# Patient Record
Sex: Female | Born: 1940 | Race: White | Hispanic: No | Smoking: Never smoker
Health system: Southern US, Community
[De-identification: ages and names within clinical notes are randomized; demographics above are authoritative.]

## PROBLEM LIST (undated history)

## (undated) DIAGNOSIS — E785 Hyperlipidemia, unspecified: Secondary | ICD-10-CM

## (undated) DIAGNOSIS — I071 Rheumatic tricuspid insufficiency: Secondary | ICD-10-CM

## (undated) DIAGNOSIS — E039 Hypothyroidism, unspecified: Secondary | ICD-10-CM

## (undated) DIAGNOSIS — Z860100 Personal history of colon polyps, unspecified: Secondary | ICD-10-CM

## (undated) DIAGNOSIS — E079 Disorder of thyroid, unspecified: Secondary | ICD-10-CM

## (undated) DIAGNOSIS — S82843A Displaced bimalleolar fracture of unspecified lower leg, initial encounter for closed fracture: Secondary | ICD-10-CM

## (undated) DIAGNOSIS — Z1239 Encounter for other screening for malignant neoplasm of breast: Secondary | ICD-10-CM

## (undated) DIAGNOSIS — Z124 Encounter for screening for malignant neoplasm of cervix: Secondary | ICD-10-CM

## (undated) HISTORY — PX: ESOPHAGOGASTRODUODENOSCOPY: SHX1529

## (undated) HISTORY — PX: BUNIONECTOMY: SHX129

## (undated) HISTORY — DX: Personal history of colon polyps, unspecified: Z86.0100

## (undated) HISTORY — DX: Encounter for screening for malignant neoplasm of cervix: Z12.4

## (undated) HISTORY — DX: Disorder of thyroid, unspecified: E07.9

## (undated) HISTORY — DX: Encounter for other screening for malignant neoplasm of breast: Z12.39

## (undated) HISTORY — PX: COLONOSCOPY: SHX174

## (undated) HISTORY — DX: Displaced bimalleolar fracture of unspecified lower leg, initial encounter for closed fracture: S82.843A

## (undated) HISTORY — DX: Hyperlipidemia, unspecified: E78.5

## (undated) HISTORY — DX: Rheumatic tricuspid insufficiency: I07.1

## (undated) HISTORY — PX: HERNIA REPAIR: SHX51

## (undated) HISTORY — PX: FRACTURE SURGERY: SHX138

## (undated) HISTORY — PX: OTHER SURGICAL HISTORY: SHX169

---

## 1968-10-15 HISTORY — PX: BREAST CYST EXCISION: SHX579

## 1974-10-15 HISTORY — PX: VAGINAL HYSTERECTOMY: SUR661

## 1982-10-15 HISTORY — PX: TRIGGER FINGER RELEASE: SHX641

## 1992-10-15 HISTORY — PX: SPINE SURGERY: SHX786

## 2008-10-15 DIAGNOSIS — Z124 Encounter for screening for malignant neoplasm of cervix: Secondary | ICD-10-CM

## 2008-10-15 HISTORY — DX: Encounter for screening for malignant neoplasm of cervix: Z12.4

## 2009-01-06 LAB — HM COLONOSCOPY: HM Colonoscopy: NORMAL

## 2009-10-15 DIAGNOSIS — Z1239 Encounter for other screening for malignant neoplasm of breast: Secondary | ICD-10-CM

## 2009-10-15 HISTORY — DX: Encounter for other screening for malignant neoplasm of breast: Z12.39

## 2010-10-08 LAB — HM MAMMOGRAPHY: HM Mammogram: NORMAL

## 2011-02-17 DIAGNOSIS — S82843A Displaced bimalleolar fracture of unspecified lower leg, initial encounter for closed fracture: Secondary | ICD-10-CM

## 2011-02-17 HISTORY — DX: Displaced bimalleolar fracture of unspecified lower leg, initial encounter for closed fracture: S82.843A

## 2011-08-15 ENCOUNTER — Ambulatory Visit (INDEPENDENT_AMBULATORY_CARE_PROVIDER_SITE_OTHER): Payer: Medicare Other | Admitting: Internal Medicine

## 2011-08-15 ENCOUNTER — Encounter: Payer: Self-pay | Admitting: Internal Medicine

## 2011-08-15 DIAGNOSIS — E559 Vitamin D deficiency, unspecified: Secondary | ICD-10-CM

## 2011-08-15 DIAGNOSIS — E039 Hypothyroidism, unspecified: Secondary | ICD-10-CM

## 2011-08-15 DIAGNOSIS — E785 Hyperlipidemia, unspecified: Secondary | ICD-10-CM

## 2011-08-15 DIAGNOSIS — Z800 Family history of malignant neoplasm of digestive organs: Secondary | ICD-10-CM

## 2011-08-15 DIAGNOSIS — Z13220 Encounter for screening for lipoid disorders: Secondary | ICD-10-CM

## 2011-08-15 DIAGNOSIS — S82843A Displaced bimalleolar fracture of unspecified lower leg, initial encounter for closed fracture: Secondary | ICD-10-CM | POA: Insufficient documentation

## 2011-08-15 DIAGNOSIS — Z1239 Encounter for other screening for malignant neoplasm of breast: Secondary | ICD-10-CM | POA: Insufficient documentation

## 2011-08-15 DIAGNOSIS — R5383 Other fatigue: Secondary | ICD-10-CM

## 2011-08-15 DIAGNOSIS — Z13820 Encounter for screening for osteoporosis: Secondary | ICD-10-CM | POA: Insufficient documentation

## 2011-08-15 DIAGNOSIS — E538 Deficiency of other specified B group vitamins: Secondary | ICD-10-CM

## 2011-08-15 LAB — COMPREHENSIVE METABOLIC PANEL
ALT: 26 U/L (ref 0–35)
Albumin: 4.4 g/dL (ref 3.5–5.2)
CO2: 27 mEq/L (ref 19–32)
Calcium: 9.5 mg/dL (ref 8.4–10.5)
Chloride: 106 mEq/L (ref 96–112)
Creatinine, Ser: 1.1 mg/dL (ref 0.4–1.2)
GFR: 55.23 mL/min — ABNORMAL LOW (ref >60.00)
Potassium: 4.2 mEq/L (ref 3.5–5.1)
Sodium: 141 mEq/L (ref 135–145)
Total Protein: 7.5 g/dL (ref 6.0–8.3)

## 2011-08-15 LAB — LIPID PANEL
Total CHOL/HDL Ratio: 3
Triglycerides: 107 mg/dL (ref 0.0–149.0)

## 2011-08-15 LAB — CBC WITH DIFFERENTIAL/PLATELET
Basophils Absolute: 0 10*3/uL (ref 0.0–0.1)
Lymphocytes Relative: 29 % (ref 12.0–46.0)
Lymphs Abs: 1.8 10*3/uL (ref 0.7–4.0)
Monocytes Relative: 7 % (ref 3.0–12.0)
Neutrophils Relative %: 61.6 % (ref 43.0–77.0)
Platelets: 245 10*3/uL (ref 150.0–400.0)
RDW: 14.1 % (ref 11.5–14.6)
WBC: 6.1 10*3/uL (ref 4.5–10.5)

## 2011-08-15 LAB — TSH: TSH: 3 u[IU]/mL (ref 0.35–5.50)

## 2011-08-15 LAB — MAGNESIUM: Magnesium: 2.2 mg/dL (ref 1.5–2.5)

## 2011-08-15 NOTE — Assessment & Plan Note (Addendum)
Prior mammogram done by PCP who moved and office went out of business, last Fall.  Dr Seleta Rhymes [phone].  Jeanice Lim Kateri Mc

## 2011-08-16 NOTE — Progress Notes (Addendum)
  Subjective:    Patient ID: Catherine Smith, female    DOB: Jun 19, 1941, 70 y.o.   MRN: [id number]  HPI  70 yo white female with a history of hyothyroidism and hypertension presents for establishment of care .  She has no acute issues today.  She suffered a fall at home In May 2012 and  had a bimalleolar fracture as a result.  Seh continues to work with PT and is wearing Orthotics brace.  Some pain and edema noted by the end of the day. Ambulating with use of a cane.   Past Medical History  Diagnosis Date  . Bimalleolar ankle fracture Feb 17 2011    surgery Dr. Olga Millers and Jarold Motto, [facility]  . Hyperlipidemia   . Thyroid disease   . Screening for breast cancer 2011    normal  . Pap smear for cervical cancer screening 2010    normal   No current outpatient prescriptions on file prior to visit.     Review of Systems  Constitutional: Negative for fever, chills and unexpected weight change.  HENT: Negative for hearing loss, ear pain, nosebleeds, congestion, sore throat, facial swelling, rhinorrhea, sneezing, mouth sores, trouble swallowing, neck pain, neck stiffness, voice change, postnasal drip, sinus pressure, tinnitus and ear discharge.   Eyes: Negative for pain, discharge, redness and visual disturbance.  Respiratory: Negative for cough, chest tightness, shortness of breath, wheezing and stridor.   Cardiovascular: Negative for chest pain, palpitations and leg swelling.  Musculoskeletal: Positive for joint swelling and arthralgias. Negative for myalgias.  Skin: Negative for color change and rash.  Neurological: Negative for dizziness, weakness, light-headedness and headaches.  Hematological: Negative for adenopathy.      BP 130/73  Pulse 95  Temp(Src) 97.8 F (36.6 C) (Oral)  Resp 16  Ht 5\' 4"  (1.626 m)  Wt 152 lb 8 oz (69.174 kg)  BMI 26.18 kg/m2  SpO2 98%  Objective:   Physical Exam  Constitutional: She is oriented to person, place, and time. She appears well-developed and  well-nourished.  HENT:  Mouth/Throat: Oropharynx is clear and moist.  Eyes: EOM are normal. Pupils are equal, round, and reactive to light. No scleral icterus.  Neck: Normal range of motion. Neck supple. No JVD present. No thyromegaly present.  Cardiovascular: Normal rate, regular rhythm, normal heart sounds and intact distal pulses.   Pulmonary/Chest: Effort normal and breath sounds normal.  Abdominal: Soft. Bowel sounds are normal. She exhibits no mass. There is no tenderness.  Musculoskeletal: She exhibits edema and tenderness.       Left ankle: She exhibits swelling. tenderness. Lateral malleolus and medial malleolus tenderness found.  Lymphadenopathy:    She has no cervical adenopathy.  Neurological: She is alert and oriented to person, place, and time.  Skin: Skin is warm and dry.  Psychiatric: She has a normal mood and affect.          Assessment & Plan:  History of bimalleolar fracture secondary to fall.  She has undergone surgery and PT but still wears the brace per orthoepdics.    Edema:  Secondary to trauma, with dependent edema,,  Elevate leg .  Hypothyroidism:  Is in need of TSH recehck as it has been 6 months.  Hypertension: well controlled currently.  Preventive screenings:  Breast, colon and Cervical cancers reviewed.  She is s/p hysterectomy, is due for mmamogram this fall, and receives q 3 yr colonoscopies at [facility] due to her family history .

## 2011-08-19 ENCOUNTER — Encounter: Payer: Self-pay | Admitting: Internal Medicine

## 2011-08-19 DIAGNOSIS — Z124 Encounter for screening for malignant neoplasm of cervix: Secondary | ICD-10-CM | POA: Insufficient documentation

## 2011-08-20 ENCOUNTER — Ambulatory Visit: Payer: Medicare Other | Admitting: *Deleted

## 2011-08-20 DIAGNOSIS — E538 Deficiency of other specified B group vitamins: Secondary | ICD-10-CM

## 2011-08-20 MED ORDER — CYANOCOBALAMIN 1000 MCG/ML IJ SOLN
1000.0000 ug | Freq: Once | INTRAMUSCULAR | Status: AC
  Administered 2011-08-20: 1000 ug via INTRAMUSCULAR

## 2011-08-24 ENCOUNTER — Encounter: Payer: Self-pay | Admitting: Internal Medicine

## 2011-08-24 NOTE — Progress Notes (Deleted)
  Subjective:    Patient ID: Catherine Smith, female    DOB: Mar 30, 1941, 70 y.o.   MRN: [id number]  HPI 70 yo white female with a history of hypertension and previous ankle fractures (traumatic)who is  here to establish primary care.She has no acute issues today.  She is recovering physically from her ankle fractures and contineus to wear a brace for support and attend PT.  Some pain and LE edema with prolonged standing .  Past Medical History  Diagnosis Date  . Bimalleolar ankle fracture Feb 17 2011    surgery Dr. Olga Millers and Jarold Motto, [facility]  . Hyperlipidemia   . Thyroid disease   . Screening for breast cancer 2011    normal  . Pap smear for cervical cancer screening 2010    normal   No current outpatient prescriptions on file prior to visit.       Review of Systems  Constitutional: Positive for activity change. Negative for fever, chills and unexpected weight change.  HENT: Negative for hearing loss, ear pain, nosebleeds, congestion, sore throat, facial swelling, rhinorrhea, sneezing, mouth sores, trouble swallowing, neck pain, neck stiffness, voice change, postnasal drip, sinus pressure, tinnitus and ear discharge.   Eyes: Negative for pain, discharge, redness and visual disturbance.  Respiratory: Negative for cough, chest tightness, shortness of breath, wheezing and stridor.   Cardiovascular: Positive for leg swelling. Negative for chest pain and palpitations.  Musculoskeletal: Positive for arthralgias. Negative for myalgias.  Skin: Negative for color change and rash.  Neurological: Negative for dizziness, weakness, light-headedness and headaches.  Hematological: Negative for adenopathy.       Objective:   Physical Exam  Constitutional: She is oriented to person, place, and time. She appears well-developed and well-nourished.  HENT:  Mouth/Throat: Oropharynx is clear and moist.  Eyes: EOM are normal. Pupils are equal, round, and reactive to light. No scleral icterus.    Neck: Normal range of motion. Neck supple. No JVD present. No thyromegaly present.  Cardiovascular: Normal rate, regular rhythm, normal heart sounds and intact distal pulses.   Pulmonary/Chest: Effort normal and breath sounds normal.  Abdominal: Soft. Bowel sounds are normal. She exhibits no mass. There is no tenderness.  Musculoskeletal: Normal range of motion. She exhibits edema and tenderness.       Left ankle: She exhibits swelling. tenderness.  Lymphadenopathy:    She has no cervical adenopathy.  Neurological: She is alert and oriented to person, place, and time.  Skin: Skin is warm and dry.  Psychiatric: She has a normal mood and affect.          Assessment & Plan:

## 2011-08-24 NOTE — Progress Notes (Deleted)
  Subjective:    Patient ID: Catherine Smith, female    DOB: 05-17-1941, 70 y.o.   MRN: [id number]  HPI    Review of Systems     Objective:   Physical Exam        Assessment & Plan:

## 2011-08-24 NOTE — Progress Notes (Deleted)
  Subjective:    Patient ID: Catherine Smith, female    DOB: Mar 17, 1941, 69 y.o.   MRN: [id number]  HPI    Review of Systems     Objective:   Physical Exam  Constitutional: She is oriented to person, place, and time. She appears well-developed and well-nourished.  HENT:  Mouth/Throat: Oropharynx is clear and moist.  Eyes: EOM are normal. Pupils are equal, round, and reactive to light. No scleral icterus.  Neck: Normal range of motion. Neck supple. No JVD present. No thyromegaly present.  Cardiovascular: Normal rate, regular rhythm, normal heart sounds and intact distal pulses.   Pulmonary/Chest: Effort normal and breath sounds normal.  Abdominal: Soft. Bowel sounds are normal. She exhibits no mass. There is no tenderness.  Musculoskeletal: Normal range of motion. She exhibits no edema.  Lymphadenopathy:    She has no cervical adenopathy.  Neurological: She is alert and oriented to person, place, and time.  Skin: Skin is warm and dry.  Psychiatric: She has a normal mood and affect.          Assessment & Plan:

## 2011-08-27 ENCOUNTER — Ambulatory Visit (INDEPENDENT_AMBULATORY_CARE_PROVIDER_SITE_OTHER): Payer: Medicare Other | Admitting: Internal Medicine

## 2011-08-27 DIAGNOSIS — E538 Deficiency of other specified B group vitamins: Secondary | ICD-10-CM

## 2011-08-27 MED ORDER — CYANOCOBALAMIN 1000 MCG/ML IJ SOLN
1000.0000 ug | Freq: Once | INTRAMUSCULAR | Status: AC
  Administered 2011-08-27: 1000 ug via INTRAMUSCULAR

## 2011-09-11 ENCOUNTER — Ambulatory Visit (INDEPENDENT_AMBULATORY_CARE_PROVIDER_SITE_OTHER): Payer: Medicare Other | Admitting: *Deleted

## 2011-09-11 DIAGNOSIS — E538 Deficiency of other specified B group vitamins: Secondary | ICD-10-CM

## 2011-09-11 MED ORDER — CYANOCOBALAMIN 1000 MCG/ML IJ SOLN
1000.0000 ug | Freq: Once | INTRAMUSCULAR | Status: AC
  Administered 2011-09-11: 1000 ug via INTRAMUSCULAR

## 2011-10-01 ENCOUNTER — Ambulatory Visit (INDEPENDENT_AMBULATORY_CARE_PROVIDER_SITE_OTHER): Payer: Medicare Other | Admitting: *Deleted

## 2011-10-01 DIAGNOSIS — E538 Deficiency of other specified B group vitamins: Secondary | ICD-10-CM

## 2011-10-01 MED ORDER — CYANOCOBALAMIN 1000 MCG/ML IJ SOLN
1000.0000 ug | Freq: Once | INTRAMUSCULAR | Status: AC
  Administered 2011-10-01: 1000 ug via INTRAMUSCULAR

## 2011-10-24 ENCOUNTER — Telehealth: Payer: Self-pay | Admitting: Internal Medicine

## 2011-10-25 ENCOUNTER — Ambulatory Visit: Payer: Medicare Other

## 2011-10-25 MED ORDER — LEVOTHYROXINE SODIUM 50 MCG PO TABS: ORAL_TABLET | ORAL | Status: DC

## 2011-10-25 MED ORDER — ROSUVASTATIN CALCIUM 5 MG PO TABS: 5.0000 mg | ORAL_TABLET | Freq: Every day | ORAL | Status: DC

## 2011-10-25 NOTE — Telephone Encounter (Signed)
Patient is coming in on the 24th for her b12. Rx sent for crestor and synthroid.

## 2011-10-29 ENCOUNTER — Telehealth: Payer: Self-pay | Admitting: *Deleted

## 2011-10-29 MED ORDER — LEVOTHYROXINE SODIUM 50 MCG PO TABS: ORAL_TABLET | ORAL | Status: DC

## 2011-10-29 NOTE — Telephone Encounter (Signed)
One tablet daily,  Chart updated

## 2011-10-29 NOTE — Telephone Encounter (Signed)
[employer] has sent a form asking for clarification for synthroid, form is in your red folder.

## 2011-10-30 NOTE — Telephone Encounter (Signed)
Form faxed back to [employer]. 

## 2011-11-08 ENCOUNTER — Ambulatory Visit (INDEPENDENT_AMBULATORY_CARE_PROVIDER_SITE_OTHER): Payer: Medicare Other | Admitting: *Deleted

## 2011-11-08 DIAGNOSIS — E538 Deficiency of other specified B group vitamins: Secondary | ICD-10-CM | POA: Diagnosis not present

## 2011-11-08 MED ORDER — CYANOCOBALAMIN 1000 MCG/ML IJ SOLN
1000.0000 ug | Freq: Once | INTRAMUSCULAR | Status: AC
  Administered 2011-11-08: 1000 ug via INTRAMUSCULAR

## 2011-12-10 ENCOUNTER — Telehealth: Payer: Self-pay | Admitting: Internal Medicine

## 2011-12-10 NOTE — Telephone Encounter (Signed)
[phone] Pt called to see if her vit b 12 rx was called in  [facility] pharmacy [phone]  Please advise pt when this has been called in.   Pt stated she talked to dr [employer] nurse about this last week

## 2011-12-11 ENCOUNTER — Telehealth: Payer: Self-pay | Admitting: Internal Medicine

## 2011-12-11 MED ORDER — CYANOCOBALAMIN 1000 MCG/ML IJ SOLN: 1000.0000 ug | INTRAMUSCULAR | Status: DC

## 2011-12-11 NOTE — Telephone Encounter (Signed)
Rx has been called in and patient notified 

## 2011-12-11 NOTE — Telephone Encounter (Signed)
[phone] Pt called checking on her rx for b12 at [facility].   The pharmacy still doesn't have this rx its been since 11/08/11 Please advise when called in Pt is going out of town Thursday would like to get rx asap

## 2012-01-08 ENCOUNTER — Telehealth: Payer: Self-pay | Admitting: Internal Medicine

## 2012-01-08 DIAGNOSIS — Z79899 Other long term (current) drug therapy: Secondary | ICD-10-CM

## 2012-01-08 DIAGNOSIS — E785 Hyperlipidemia, unspecified: Secondary | ICD-10-CM

## 2012-01-08 DIAGNOSIS — E538 Deficiency of other specified B group vitamins: Secondary | ICD-10-CM

## 2012-01-08 DIAGNOSIS — E039 Hypothyroidism, unspecified: Secondary | ICD-10-CM

## 2012-01-08 MED ORDER — CYANOCOBALAMIN 1000 MCG/ML IJ SOLN: 1000.0000 ug | INTRAMUSCULAR | Status: DC

## 2012-01-08 NOTE — Telephone Encounter (Signed)
rx sent for 10 ml bottle of b12.  Labs ordered,  Fasting ,  She is due, a nd  yes she can get her b12 shot when she is here for labs

## 2012-01-08 NOTE — Telephone Encounter (Signed)
Pt would like to get rx for b12 sent to [facility]   Also is it time for her to get 6 month labs if so she wants to get her b12 shot here at that time   Please advise

## 2012-01-08 NOTE — Telephone Encounter (Signed)
Please advise on what labs patient needs.

## 2012-01-09 NOTE — Telephone Encounter (Signed)
Notified patient of Rx and lab

## 2012-01-15 ENCOUNTER — Other Ambulatory Visit (INDEPENDENT_AMBULATORY_CARE_PROVIDER_SITE_OTHER): Payer: Medicare Other | Admitting: *Deleted

## 2012-01-15 ENCOUNTER — Telehealth: Payer: Self-pay | Admitting: *Deleted

## 2012-01-15 DIAGNOSIS — E039 Hypothyroidism, unspecified: Secondary | ICD-10-CM | POA: Diagnosis not present

## 2012-01-15 DIAGNOSIS — R5383 Other fatigue: Secondary | ICD-10-CM

## 2012-01-15 DIAGNOSIS — E538 Deficiency of other specified B group vitamins: Secondary | ICD-10-CM | POA: Diagnosis not present

## 2012-01-15 DIAGNOSIS — Z1321 Encounter for screening for nutritional disorder: Secondary | ICD-10-CM

## 2012-01-15 DIAGNOSIS — E559 Vitamin D deficiency, unspecified: Secondary | ICD-10-CM | POA: Diagnosis not present

## 2012-01-15 DIAGNOSIS — E785 Hyperlipidemia, unspecified: Secondary | ICD-10-CM | POA: Diagnosis not present

## 2012-01-15 DIAGNOSIS — Z79899 Other long term (current) drug therapy: Secondary | ICD-10-CM

## 2012-01-15 LAB — COMPLETE METABOLIC PANEL WITH GFR
ALT: 16 U/L (ref 0–35)
AST: 21 U/L (ref 0–37)
Albumin: 4.4 g/dL (ref 3.5–5.2)
Calcium: 9.5 mg/dL (ref 8.4–10.5)
Chloride: 105 mEq/L (ref 96–112)
Potassium: 4.2 mEq/L (ref 3.5–5.3)
Sodium: 141 mEq/L (ref 135–145)
Total Protein: 6.4 g/dL (ref 6.0–8.3)

## 2012-01-15 LAB — LIPID PANEL
Cholesterol: 152 mg/dL (ref 0–200)
LDL Cholesterol: 74 mg/dL (ref 0–99)
Total CHOL/HDL Ratio: 3

## 2012-01-15 NOTE — Telephone Encounter (Signed)
Patient came in this morning for labs and while she was here she asked if we could add a B12 and Vit D to her order. Is it okay to add these on?

## 2012-01-15 NOTE — Telephone Encounter (Signed)
Yes ok to do

## 2012-01-15 NOTE — Progress Notes (Signed)
Addended by: Jobie Quaker on: 01/15/2012 01:55 PM   Modules accepted: Orders

## 2012-01-15 NOTE — Telephone Encounter (Signed)
Ordered

## 2012-02-07 ENCOUNTER — Telehealth: Payer: Self-pay | Admitting: Internal Medicine

## 2012-02-07 NOTE — Telephone Encounter (Signed)
Labs faxed to number provided

## 2012-02-07 NOTE — Telephone Encounter (Signed)
Patient is wanting her last lab results faxed to her the number is [phone].

## 2012-05-06 ENCOUNTER — Telehealth: Payer: Self-pay | Admitting: Internal Medicine

## 2012-05-06 DIAGNOSIS — E538 Deficiency of other specified B group vitamins: Secondary | ICD-10-CM

## 2012-05-06 MED ORDER — CYANOCOBALAMIN 1000 MCG/ML IJ SOLN: 1000.0000 ug | INTRAMUSCULAR | Status: AC

## 2012-05-06 NOTE — Addendum Note (Signed)
Addended by: Darletta Moll A on: 05/06/2012 12:00 PM   Modules accepted: Orders

## 2012-05-06 NOTE — Telephone Encounter (Signed)
Left detailed message notifying patient.  Rx has been called in.

## 2012-05-06 NOTE — Telephone Encounter (Signed)
No, if she plans to stay on monthly injections and has not missed any ,, dose not need a level.

## 2012-05-06 NOTE — Telephone Encounter (Signed)
Patient called and stated she needs a refill on her B-12 injections and wanted to know if she needed to have her labs drawn again to recheck the level before the refill.  It was last checked in April.  Please advise.

## 2012-06-30 DIAGNOSIS — M25559 Pain in unspecified hip: Secondary | ICD-10-CM | POA: Diagnosis not present

## 2012-07-07 DIAGNOSIS — M5126 Other intervertebral disc displacement, lumbar region: Secondary | ICD-10-CM | POA: Diagnosis not present

## 2012-07-09 DIAGNOSIS — M5126 Other intervertebral disc displacement, lumbar region: Secondary | ICD-10-CM | POA: Diagnosis not present

## 2012-07-15 DIAGNOSIS — M5126 Other intervertebral disc displacement, lumbar region: Secondary | ICD-10-CM | POA: Diagnosis not present

## 2012-07-15 HISTORY — PX: BACK SURGERY: SHX140

## 2012-07-23 DIAGNOSIS — Z01818 Encounter for other preprocedural examination: Secondary | ICD-10-CM | POA: Diagnosis not present

## 2012-07-23 DIAGNOSIS — M5126 Other intervertebral disc displacement, lumbar region: Secondary | ICD-10-CM | POA: Diagnosis not present

## 2012-07-23 DIAGNOSIS — Z9109 Other allergy status, other than to drugs and biological substances: Secondary | ICD-10-CM | POA: Diagnosis not present

## 2012-07-23 DIAGNOSIS — R209 Unspecified disturbances of skin sensation: Secondary | ICD-10-CM | POA: Diagnosis not present

## 2012-07-23 DIAGNOSIS — M216X9 Other acquired deformities of unspecified foot: Secondary | ICD-10-CM | POA: Diagnosis not present

## 2012-07-23 DIAGNOSIS — M48062 Spinal stenosis, lumbar region with neurogenic claudication: Secondary | ICD-10-CM | POA: Diagnosis not present

## 2012-07-23 DIAGNOSIS — M79609 Pain in unspecified limb: Secondary | ICD-10-CM | POA: Diagnosis not present

## 2012-07-23 DIAGNOSIS — M549 Dorsalgia, unspecified: Secondary | ICD-10-CM | POA: Diagnosis not present

## 2012-07-25 DIAGNOSIS — M216X9 Other acquired deformities of unspecified foot: Secondary | ICD-10-CM | POA: Diagnosis not present

## 2012-07-25 DIAGNOSIS — M48062 Spinal stenosis, lumbar region with neurogenic claudication: Secondary | ICD-10-CM | POA: Diagnosis not present

## 2012-07-25 DIAGNOSIS — R209 Unspecified disturbances of skin sensation: Secondary | ICD-10-CM | POA: Diagnosis not present

## 2012-07-25 DIAGNOSIS — M549 Dorsalgia, unspecified: Secondary | ICD-10-CM | POA: Diagnosis not present

## 2012-07-25 DIAGNOSIS — M5126 Other intervertebral disc displacement, lumbar region: Secondary | ICD-10-CM | POA: Diagnosis not present

## 2012-07-25 DIAGNOSIS — M48061 Spinal stenosis, lumbar region without neurogenic claudication: Secondary | ICD-10-CM | POA: Diagnosis not present

## 2012-07-25 DIAGNOSIS — M79609 Pain in unspecified limb: Secondary | ICD-10-CM | POA: Diagnosis not present

## 2012-07-25 DIAGNOSIS — Z9109 Other allergy status, other than to drugs and biological substances: Secondary | ICD-10-CM | POA: Diagnosis not present

## 2012-07-26 DIAGNOSIS — R209 Unspecified disturbances of skin sensation: Secondary | ICD-10-CM | POA: Diagnosis not present

## 2012-07-26 DIAGNOSIS — M5126 Other intervertebral disc displacement, lumbar region: Secondary | ICD-10-CM | POA: Diagnosis not present

## 2012-07-26 DIAGNOSIS — Z9109 Other allergy status, other than to drugs and biological substances: Secondary | ICD-10-CM | POA: Diagnosis not present

## 2012-07-26 DIAGNOSIS — M549 Dorsalgia, unspecified: Secondary | ICD-10-CM | POA: Diagnosis not present

## 2012-07-26 DIAGNOSIS — M48062 Spinal stenosis, lumbar region with neurogenic claudication: Secondary | ICD-10-CM | POA: Diagnosis not present

## 2012-07-26 DIAGNOSIS — M216X9 Other acquired deformities of unspecified foot: Secondary | ICD-10-CM | POA: Diagnosis not present

## 2012-07-26 DIAGNOSIS — M79609 Pain in unspecified limb: Secondary | ICD-10-CM | POA: Diagnosis not present

## 2012-08-07 ENCOUNTER — Telehealth: Payer: Self-pay

## 2012-08-07 NOTE — Telephone Encounter (Signed)
Does not need the B12 or vitamin D. Thanks

## 2012-08-08 ENCOUNTER — Telehealth: Payer: Self-pay

## 2012-08-08 NOTE — Telephone Encounter (Signed)
Ordered cmet, tsh, and fasting lipids.

## 2012-08-11 ENCOUNTER — Other Ambulatory Visit (INDEPENDENT_AMBULATORY_CARE_PROVIDER_SITE_OTHER): Payer: Medicare Other

## 2012-08-11 ENCOUNTER — Ambulatory Visit (INDEPENDENT_AMBULATORY_CARE_PROVIDER_SITE_OTHER): Payer: Medicare Other | Admitting: Internal Medicine

## 2012-08-11 DIAGNOSIS — E039 Hypothyroidism, unspecified: Secondary | ICD-10-CM

## 2012-08-11 DIAGNOSIS — E785 Hyperlipidemia, unspecified: Secondary | ICD-10-CM

## 2012-08-11 DIAGNOSIS — Z79899 Other long term (current) drug therapy: Secondary | ICD-10-CM

## 2012-08-11 DIAGNOSIS — Z23 Encounter for immunization: Secondary | ICD-10-CM

## 2012-08-11 LAB — TSH: TSH: 10.64 u[IU]/mL — ABNORMAL HIGH (ref 0.35–5.50)

## 2012-08-11 LAB — LIPID PANEL
HDL: 53.6 mg/dL (ref >39.00)
LDL Cholesterol: 98 mg/dL (ref 0–99)
Total CHOL/HDL Ratio: 3
VLDL: 10.4 mg/dL (ref 0.0–40.0)

## 2012-08-12 LAB — COMPLETE METABOLIC PANEL WITH GFR
ALT: 17 U/L (ref 0–35)
AST: 23 U/L (ref 0–37)
Alkaline Phosphatase: 54 U/L (ref 39–117)
Creat: 1.03 mg/dL (ref 0.50–1.10)
GFR, Est African American: 64 mL/min
Sodium: 142 mEq/L (ref 135–145)
Total Bilirubin: 0.6 mg/dL (ref 0.3–1.2)
Total Protein: 6.3 g/dL (ref 6.0–8.3)

## 2012-08-12 MED ORDER — LEVOTHYROXINE SODIUM 75 MCG PO TABS: ORAL_TABLET | ORAL | Status: DC

## 2012-08-12 NOTE — Addendum Note (Signed)
Addended by: Sherlene Shams on: 08/12/2012 06:16 PM   Modules accepted: Orders

## 2012-08-13 ENCOUNTER — Other Ambulatory Visit: Payer: Self-pay

## 2012-10-13 DIAGNOSIS — M6530 Trigger finger, unspecified finger: Secondary | ICD-10-CM | POA: Diagnosis not present

## 2012-10-27 DIAGNOSIS — M6530 Trigger finger, unspecified finger: Secondary | ICD-10-CM | POA: Diagnosis not present

## 2012-11-11 DIAGNOSIS — H2510 Age-related nuclear cataract, unspecified eye: Secondary | ICD-10-CM | POA: Diagnosis not present

## 2012-11-26 ENCOUNTER — Other Ambulatory Visit: Payer: Self-pay | Admitting: Internal Medicine

## 2012-11-26 NOTE — Telephone Encounter (Signed)
Pt only given 30 day Rx due to fact has not been seen since 2012. Pt needs an OV to get full 90 day refill.

## 2012-12-04 DIAGNOSIS — D126 Benign neoplasm of colon, unspecified: Secondary | ICD-10-CM | POA: Diagnosis not present

## 2012-12-04 DIAGNOSIS — Z800 Family history of malignant neoplasm of digestive organs: Secondary | ICD-10-CM | POA: Diagnosis not present

## 2012-12-04 DIAGNOSIS — Z1211 Encounter for screening for malignant neoplasm of colon: Secondary | ICD-10-CM | POA: Diagnosis not present

## 2012-12-09 ENCOUNTER — Ambulatory Visit (INDEPENDENT_AMBULATORY_CARE_PROVIDER_SITE_OTHER): Payer: Medicare Other | Admitting: Internal Medicine

## 2012-12-09 ENCOUNTER — Encounter: Payer: Self-pay | Admitting: Internal Medicine

## 2012-12-09 ENCOUNTER — Other Ambulatory Visit: Payer: Self-pay | Admitting: Internal Medicine

## 2012-12-09 VITALS — BP 130/76 | HR 73 | Temp 98.0°F | Resp 16 | Wt 149.0 lb

## 2012-12-09 DIAGNOSIS — Z9889 Other specified postprocedural states: Secondary | ICD-10-CM

## 2012-12-09 DIAGNOSIS — Z1239 Encounter for other screening for malignant neoplasm of breast: Secondary | ICD-10-CM

## 2012-12-09 DIAGNOSIS — E039 Hypothyroidism, unspecified: Secondary | ICD-10-CM | POA: Diagnosis not present

## 2012-12-09 DIAGNOSIS — E785 Hyperlipidemia, unspecified: Secondary | ICD-10-CM | POA: Diagnosis not present

## 2012-12-09 DIAGNOSIS — E559 Vitamin D deficiency, unspecified: Secondary | ICD-10-CM | POA: Diagnosis not present

## 2012-12-09 LAB — LDL CHOLESTEROL, DIRECT: Direct LDL: 110.6 mg/dL

## 2012-12-09 MED ORDER — ROSUVASTATIN CALCIUM 5 MG PO TABS: 5.0000 mg | ORAL_TABLET | Freq: Every day | ORAL | Status: DC

## 2012-12-09 NOTE — Assessment & Plan Note (Signed)
Recommended supervision of exercise progeam by a physical therapist to avoid reinjury of back

## 2012-12-09 NOTE — Patient Instructions (Addendum)
I advise you to resume your exercise with a physical therapist.   I will notify you of your lab results  Please set up your annual Medicare wellness exam with the front office

## 2012-12-09 NOTE — Assessment & Plan Note (Signed)
With prior fractures.  checking level today

## 2012-12-09 NOTE — Progress Notes (Signed)
Patient ID: Catherine Smith, female   DOB: 1940-12-01, 72 y.o.   MRN: [id number]  Patient Active Problem List  Diagnosis  . Bimalleolar ankle fracture  . Screening for osteoporosis  . Screening for breast cancer  . Family history of colon cancer  . Pap smear for cervical cancer screening  . Vitamin D deficiency  . Unspecified hypothyroidism  . History of back surgery    Subjective:  CC:   Chief Complaint  Patient presents with  . Follow-up    Med refill    HPI:   Catherine Smith a 72 y.o. female who presents for follow up on hypothyroidism and hyperlipidemia,  Last seen 1.5 yrs ago,  Labs done oct 2013 noted hypothyroidism.  She had started a soy product that "wouldn't  interfere" with her thyroid, but she stopped the soy shakes in october when the tsh was higher. She has been drinking a daily shake made of kale/greens and rice protein, flax seed,  And fruit.   No constipation issues, but has been having increased hair loss, and the outer third of her eyebrows have been thinning.  She has also gained 10 lbs.   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  Injured self during a Pil[employer] [place] workout   Past Medical History  Diagnosis Date  . Bimalleolar ankle fracture Feb 17 2011    surgery Dr. Olga Millers and Jarold Motto, [facility]  . Hyperlipidemia   . Thyroid disease   . Screening for breast cancer 2011    normal  . Pap smear for cervical cancer screening 2010    normal    Past Surgical History  Procedure Laterality Date  . Spine surgery  1994    cervical disk 3through 6, [facility]  . Bunionectomy    . Trigger finger release  1984    left  hand  . Vaginal hysterectomy  1976  . Breast cyst excision  1970    benign  . Back surgery  07/15/12    Herniated Disc L5, Sliver of bone protruding into sciatic nerve       The following portions of the patient's history were  reviewed and updated as appropriate: Allergies, current medications, and problem list.    Review of Systems:   Patient denies headache, fevers, malaise, unintentional weight loss, skin rash, eye pain, sinus congestion and sinus pain, sore throat, dysphagia,  hemoptysis , cough, dyspnea, wheezing, chest pain, palpitations, orthopnea, edema, abdominal pain, nausea, melena, diarrhea, constipation, flank pain, dysuria, hematuria, urinary  Frequency, nocturia, numbness, tingling, seizures,  Focal weakness, Loss of consciousness,  Tremor, insomnia, depression, anxiety, and suicidal ideation.     History   Social History  . Marital Status: Married    Spouse Name: N/A    Number of Children: N/A  . Years of Education: N/A   Occupational History  . Not on file.   Social History Main Topics  . Smoking status: Never Smoker   . Smokeless tobacco: Never Used  . Alcohol Use: No  . Drug Use: No  . Sexually Active: Not on file   Other Topics Concern  . Not on file   Social History Narrative  . No narrative on file    Objective:  BP 130/76  Pulse 73  Temp(Src) 98 F (36.7 C) (Oral)  Resp 16  Wt 149 lb (67.586 kg)  BMI 25.56 kg/m2  SpO2  98%  General appearance: alert, cooperative and appears stated age Ears: normal TM's and external ear canals both ears Throat: lips, mucosa, and tongue normal; teeth and gums normal Neck: no adenopathy, no carotid bruit, supple, symmetrical, trachea midline and thyroid not enlarged, symmetric, no tenderness/mass/nodules Back: symmetric, no curvature. ROM normal. No CVA tenderness. Lungs: clear to auscultation bilaterally Heart: regular rate and rhythm, S1, S2 normal, no murmur, click, rub or gallop Abdomen: soft, non-tender; bowel sounds normal; no masses,  no organomegaly Pulses: 2+ and symmetric Skin: Skin color, texture, turgor normal. No rashes or lesions Lymph nodes: Cervical, supraclavicular, and axillary nodes normal.  Assessment and  Plan:  Unspecified hypothyroidism Repeat TSH is due after adjusting dose in October.  Vitamin D deficiency With prior fractures.  checking level today  History of back surgery Recommended supervision of exercise progeam by a physical therapist to avoid reinjury of back   Screening for breast cancer She is overdue for mammogram.  She will return for annual exam.   Updated Medication List Outpatient Encounter Prescriptions as of 12/09/2012  Medication Sig Dispense Refill  . aspirin 81 MG tablet Take 81 mg by mouth daily.        . calcium citrate-vitamin D 200-200 MG-UNIT TABS Take 1 tablet by mouth 2 (two) times daily.        . cetirizine (ZYRTEC) 10 MG tablet Take 10 mg by mouth daily.        . cyanocobalamin (,VITAMIN B-12,) 1000 MCG/ML injection Inject 1 mL (1,000 mcg total) into the muscle every 30 (thirty) days.  10 mL  3  . levothyroxine (SYNTHROID, LEVOTHROID) 75 MCG tablet Brand name only One tablet daily in the early morning  90 tablet  3  . magnesium oxide (MAG-OX) 400 MG tablet Take 400 mg by mouth daily.        . rosuvastatin (CRESTOR) 5 MG tablet Take 1 tablet (5 mg total) by mouth daily.  90 tablet  2  . [DISCONTINUED] CRESTOR 5 MG tablet TAKE 1 TABLET DAILY  30 tablet  0  . [DISCONTINUED] Kelp 225 MCG TABS Take 1 tablet by mouth daily.         No facility-administered encounter medications on file as of 12/09/2012.     Orders Placed This Encounter  Procedures  . HM MAMMOGRAPHY  . HM DEXA SCAN  . TSH + free T4  . LDL cholesterol, direct  . HM PAP SMEAR  . HM COLONOSCOPY    No Follow-up on file.

## 2012-12-09 NOTE — Assessment & Plan Note (Signed)
Repeat TSH is due after adjusting dose in October.

## 2012-12-09 NOTE — Assessment & Plan Note (Signed)
She is overdue for mammogram.  She will return for annual exam.

## 2012-12-10 LAB — VITAMIN D 25 HYDROXY (VIT D DEFICIENCY, FRACTURES): Vit D, 25-Hydroxy: 75 ng/mL (ref 30–89)

## 2012-12-10 LAB — TSH+FREE T4: Free T4: 1.35 ng/dL (ref 0.82–1.77)

## 2013-02-11 DIAGNOSIS — M6530 Trigger finger, unspecified finger: Secondary | ICD-10-CM | POA: Diagnosis not present

## 2013-02-11 LAB — HM COLONOSCOPY: HM Colonoscopy: NORMAL

## 2013-03-24 DIAGNOSIS — M6530 Trigger finger, unspecified finger: Secondary | ICD-10-CM | POA: Diagnosis not present

## 2013-06-01 ENCOUNTER — Other Ambulatory Visit: Payer: Self-pay | Admitting: Internal Medicine

## 2013-07-13 ENCOUNTER — Ambulatory Visit (INDEPENDENT_AMBULATORY_CARE_PROVIDER_SITE_OTHER): Payer: Medicare Other

## 2013-07-13 DIAGNOSIS — Z23 Encounter for immunization: Secondary | ICD-10-CM | POA: Diagnosis not present

## 2013-08-14 ENCOUNTER — Encounter: Payer: Self-pay | Admitting: Internal Medicine

## 2013-08-14 ENCOUNTER — Ambulatory Visit (INDEPENDENT_AMBULATORY_CARE_PROVIDER_SITE_OTHER): Payer: Medicare Other | Admitting: Internal Medicine

## 2013-08-14 ENCOUNTER — Other Ambulatory Visit (HOSPITAL_COMMUNITY)
Admission: RE | Admit: 2013-08-14 | Discharge: 2013-08-14 | Disposition: A | Discharge status: Home / Self Care | Payer: Medicare Other | Source: Ambulatory Visit | Attending: Internal Medicine | Admitting: Internal Medicine

## 2013-08-14 ENCOUNTER — Encounter (INDEPENDENT_AMBULATORY_CARE_PROVIDER_SITE_OTHER): Payer: Self-pay

## 2013-08-14 VITALS — BP 128/68 | HR 68 | Temp 98.2°F | Resp 14 | Ht 64.1 in | Wt 154.0 lb

## 2013-08-14 DIAGNOSIS — Z0000 Encounter for general adult medical examination without abnormal findings: Secondary | ICD-10-CM

## 2013-08-14 DIAGNOSIS — Z124 Encounter for screening for malignant neoplasm of cervix: Secondary | ICD-10-CM | POA: Diagnosis not present

## 2013-08-14 DIAGNOSIS — Z23 Encounter for immunization: Secondary | ICD-10-CM

## 2013-08-14 DIAGNOSIS — M899 Disorder of bone, unspecified: Secondary | ICD-10-CM

## 2013-08-14 DIAGNOSIS — R5381 Other malaise: Secondary | ICD-10-CM

## 2013-08-14 DIAGNOSIS — Z1239 Encounter for other screening for malignant neoplasm of breast: Secondary | ICD-10-CM | POA: Diagnosis not present

## 2013-08-14 DIAGNOSIS — E039 Hypothyroidism, unspecified: Secondary | ICD-10-CM | POA: Diagnosis not present

## 2013-08-14 DIAGNOSIS — Z1151 Encounter for screening for human papillomavirus (HPV): Secondary | ICD-10-CM | POA: Diagnosis not present

## 2013-08-14 DIAGNOSIS — E785 Hyperlipidemia, unspecified: Secondary | ICD-10-CM

## 2013-08-14 DIAGNOSIS — Z01419 Encounter for gynecological examination (general) (routine) without abnormal findings: Secondary | ICD-10-CM

## 2013-08-14 DIAGNOSIS — Z13820 Encounter for screening for osteoporosis: Secondary | ICD-10-CM

## 2013-08-14 DIAGNOSIS — M8580 Other specified disorders of bone density and structure, unspecified site: Secondary | ICD-10-CM

## 2013-08-14 DIAGNOSIS — Z9889 Other specified postprocedural states: Secondary | ICD-10-CM

## 2013-08-14 DIAGNOSIS — M6530 Trigger finger, unspecified finger: Secondary | ICD-10-CM

## 2013-08-14 DIAGNOSIS — R5383 Other fatigue: Secondary | ICD-10-CM

## 2013-08-14 LAB — CBC WITH DIFFERENTIAL/PLATELET
Basophils Absolute: 0 10*3/uL (ref 0.0–0.1)
Eosinophils Absolute: 0.2 10*3/uL (ref 0.0–0.7)
HCT: 42.4 % (ref 36.0–46.0)
Lymphs Abs: 2.3 10*3/uL (ref 0.7–4.0)
MCHC: 33.3 g/dL (ref 30.0–36.0)
MCV: 92.7 fl (ref 78.0–100.0)
Monocytes Absolute: 0.5 10*3/uL (ref 0.1–1.0)
Platelets: 217 10*3/uL (ref 150.0–400.0)
RBC: 4.57 Mil/uL (ref 3.87–5.11)
RDW: 14.3 % (ref 11.5–14.6)

## 2013-08-14 LAB — COMPREHENSIVE METABOLIC PANEL
ALT: 25 U/L (ref 0–35)
AST: 28 U/L (ref 0–37)
Alkaline Phosphatase: 73 U/L (ref 39–117)
CO2: 28 mEq/L (ref 19–32)
GFR: 60.17 mL/min (ref >60.00)
Sodium: 139 mEq/L (ref 135–145)
Total Bilirubin: 0.8 mg/dL (ref 0.3–1.2)
Total Protein: 6.8 g/dL (ref 6.0–8.3)

## 2013-08-14 LAB — TSH: TSH: 0.49 u[IU]/mL (ref 0.35–5.50)

## 2013-08-14 MED ORDER — TETANUS-DIPHTH-ACELL PERTUSSIS 5-2.5-18.5 LF-MCG/0.5 IM SUSP: 0.5000 mL | Freq: Once | INTRAMUSCULAR | Status: DC

## 2013-08-14 MED ORDER — ZOSTER VACCINE LIVE 19400 UNT/0.65ML ~~LOC~~ SOLR: 0.6500 mL | Freq: Once | SUBCUTANEOUS | Status: DC

## 2013-08-14 NOTE — Progress Notes (Signed)
Patient ID: Catherine Smith, female   DOB: August 06, 1941, 72 y.o.   MRN: [id number]  The patient is here for annual Medicare wellness examination and management of other chronic and acute problems.   In the interim between visits she developed posterior right hip pain, which radiated to the foot and was found to hae a herniated disk causing sciatica. She underwent successful decompression by  Dr Granville Lewis Neurosurgery[facility]Or[facility]oped[facility].  He also treated her Trigger finger left middle finger with injections which failed to improve and she underwent surgical release by  Dr Dayna Barker ,  Both docs at [facility]   Eating a healthy diet.     The risk factors are reflected in the social history.  The roster of all physicians providing medical care to patient - is listed in the Snapshot section of the chart.  Activities of daily living:  The patient is 100% independent in all ADLs: dressing, toileting, feeding as well as independent mobility  Home safety : The patient has smoke detectors in the home. They wear seatbelts.  There are no firearms at home. There is no violence in the home.   There is no risks for hepatitis, STDs or HIV. There is no   history of blood transfusion. They have no travel history to infectious disease endemic areas of the world.  The patient has seen their dentist in the last six month. They have seen their eye doctor in the last year. They admit to slight hearing difficulty with regard to whispered voices and some television programs.  They have deferred audiologic testing in the last year.  They do not  have excessive sun exposure. Discussed the need for sun protection: hats, long sleeves and use of sunscreen if there is significant sun exposure.   Diet: the importance of a healthy diet is discussed. They do have a healthy diet.  The benefits of regular aerobic exercise were discussed. She walks 4 times per week ,  20 minutes.   Depression screen: there are no signs  or vegative symptoms of depression- irritability, change in appetite, anhedonia, sadness/tearfullness.  Cognitive assessment: the patient manages all their financial and personal affairs and is actively engaged. They could relate day,date,year and events; recalled 2/3 objects at 3 minutes; performed clock-face test normally.  The following portions of the patient's history were reviewed and updated as appropriate: allergies, current medications, past family history, past medical history,  past surgical history, past social history  and problem list.  Visual acuity was not assessed per patient preference since she has regular follow up with her ophthalmologist. Hearing and body mass index were assessed and reviewed.   During the course of the visit the patient was educated and counseled about appropriate screening and preventive services including : fall prevention , diabetes screening, nutrition counseling, colorectal cancer screening, and recommended immunizations.    Objective:  BP 128/68  Pulse 68  Temp(Src) 98.2 F (36.8 C) (Oral)  Resp 14  Ht 5' 4.1" (1.628 m)  Wt 154 lb (69.854 kg)  BMI 26.36 kg/m2  SpO2 98%  General Appearance:    Alert, cooperative, no distress, appears stated age  Head:    Normocephalic, without obvious abnormality, atraumatic  Eyes:    PERRL, conjunctiva/corneas clear, EOM's intact, fundi    benign, both eyes  Ears:    Normal TM's and external ear canals, both ears  Nose:   Nares normal, septum midline, mucosa normal, no drainage    or sinus tenderness  Throat:  Lips, mucosa, and tongue normal; teeth and gums normal  Neck:   Supple, symmetrical, trachea midline, no adenopathy;    thyroid:  no enlargement/tenderness/nodules; no carotid   bruit or JVD  Back:     Symmetric, no curvature, ROM normal, no CVA tenderness  Lungs:     Clear to auscultation bilaterally, respirations unlabored  Chest Wall:    No tenderness or deformity   Heart:    Regular rate and  rhythm, S1 and S2 normal, no murmur, rub   or gallop  Breast Exam:    No tenderness, masses, or nipple abnormality  Abdomen:     Soft, non-tender, bowel sounds active all four quadrants,    no masses, no organomegaly  Genitalia:    Normal female without lesion, discharge or tenderness  Rectal:    Normal tone, normal prostate, no masses or tenderness;   guaiac negative stool  Extremities:   Extremities normal, atraumatic, no cyanosis or edema  Pulses:   2+ and symmetric all extremities  Skin:   Skin color, texture, turgor normal, no rashes or lesions  Lymph nodes:   Cervical, supraclavicular, and axillary nodes normal  Neurologic:   CNII-XII intact, normal strength, sensation and reflexes    throughout   Assessment and Plan: Pap smear for cervical cancer screening Repeated today per patient request.   Unspecified hypothyroidism Thyroid function is WNL on current dose.  No current changes needed.   Screening for osteoporosis Repeat DEXA should be done post suspension of Actonel.   History of back surgery S/p lumbar decompression  Medicare annual wellness visit, subsequent Annual comprehensive exam was done including breast, pelvic and PAP smear. All screenings have been addressed .    Updated Medication List Outpatient Encounter Prescriptions as of 08/14/2013  Medication Sig  . aspirin 81 MG tablet Take 81 mg by mouth daily.    . calcium citrate-vitamin D 200-200 MG-UNIT TABS Take 1 tablet by mouth 2 (two) times daily.    . cetirizine (ZYRTEC) 10 MG tablet Take 10 mg by mouth daily.    . Cholecalciferol (VITAMIN D3) 2000 UNITS TABS Take 1 tablet by mouth daily.  . CRESTOR 5 MG tablet Take 1 tablet (5 mg total) by mouth daily.  . cyanocobalamin (,VITAMIN B-12,) 1000 MCG/ML injection Inject 1,000 mcg into the muscle once.  . magnesium oxide (MAG-OX) 400 MG tablet Take 400 mg by mouth daily.    Marland Kitchen SYNTHROID 75 MCG tablet TAKE ONE TABLET BY MOUTH EVERY MORNING  . TDaP (BOOSTRIX)  5-2.5-18.5 LF-MCG/0.5 injection Inject 0.5 mLs into the muscle once.  . zoster vaccine live, PF, (ZOSTAVAX) 09811 UNT/0.65ML injection Inject 19,400 Units into the skin once.

## 2013-08-14 NOTE — Patient Instructions (Addendum)
You had your annual Medicare wellness exam today  We will schedule your mammogram and bone density test today  You need to have a TDaP vaccine and a Shingles vaccine.  I have given you prescriptions for these because they will be cheaper at the health Dept or at your  local pharmacy because Medicare will not reimburse for them.   You received the pneumonia vaccine today.  We will contact you with the bloodwork results

## 2013-08-16 DIAGNOSIS — M6530 Trigger finger, unspecified finger: Secondary | ICD-10-CM | POA: Insufficient documentation

## 2013-08-16 DIAGNOSIS — Z0000 Encounter for general adult medical examination without abnormal findings: Secondary | ICD-10-CM | POA: Insufficient documentation

## 2013-08-16 NOTE — Assessment & Plan Note (Signed)
S/p lumbar decompression

## 2013-08-16 NOTE — Assessment & Plan Note (Signed)
Thyroid function is WNL on current dose.  No current changes needed.  

## 2013-08-16 NOTE — Assessment & Plan Note (Signed)
Repeat DEXA should be done post suspension of Actonel.

## 2013-08-16 NOTE — Assessment & Plan Note (Signed)
Repeated today per patient request.

## 2013-08-16 NOTE — Assessment & Plan Note (Signed)
Annual comprehensive exam was done including breast, pelvic and PAP smear. All screenings have been addressed .  

## 2013-08-17 ENCOUNTER — Encounter: Payer: Self-pay | Admitting: *Deleted

## 2013-08-18 ENCOUNTER — Encounter: Payer: Self-pay | Admitting: *Deleted

## 2013-08-25 ENCOUNTER — Ambulatory Visit: Payer: Self-pay | Admitting: Internal Medicine

## 2013-08-25 DIAGNOSIS — M899 Disorder of bone, unspecified: Secondary | ICD-10-CM | POA: Diagnosis not present

## 2013-08-25 DIAGNOSIS — Z1231 Encounter for screening mammogram for malignant neoplasm of breast: Secondary | ICD-10-CM | POA: Diagnosis not present

## 2013-08-26 DIAGNOSIS — Z23 Encounter for immunization: Secondary | ICD-10-CM | POA: Diagnosis not present

## 2013-09-04 ENCOUNTER — Encounter: Payer: Self-pay | Admitting: Internal Medicine

## 2013-09-04 ENCOUNTER — Telehealth: Payer: Self-pay | Admitting: Internal Medicine

## 2013-09-04 NOTE — Telephone Encounter (Signed)
Pt left vm.  Needs refill on Synthroid and Crestor.

## 2013-09-07 ENCOUNTER — Telehealth: Payer: Self-pay | Admitting: Internal Medicine

## 2013-09-07 DIAGNOSIS — Z13820 Encounter for screening for osteoporosis: Secondary | ICD-10-CM

## 2013-09-07 MED ORDER — LEVOTHYROXINE SODIUM 75 MCG PO TABS: ORAL_TABLET | ORAL | Status: DC

## 2013-09-07 MED ORDER — ROSUVASTATIN CALCIUM 5 MG PO TABS: ORAL_TABLET | ORAL | Status: DC

## 2013-09-07 NOTE — Telephone Encounter (Signed)
Patient in Md office at time of call patient stated she would call and find out where last bone density was completed and send information to office.

## 2013-09-07 NOTE — Telephone Encounter (Signed)
Scripts sent to [facility] as requested.

## 2013-09-07 NOTE — Telephone Encounter (Signed)
Bone density test shows osteopenia,  i do not have the old one for comparison.  Where was  It done so we can get it?

## 2013-09-07 NOTE — Telephone Encounter (Signed)
Pt states she was told to call with phone number to [facility] = [phone].  No further information given.

## 2013-09-07 NOTE — Assessment & Plan Note (Signed)
Repeat DEXA  Was done Nov 2014 and T score -2.1 , left hip,  Other scores 1.5 to -1.8

## 2013-09-11 ENCOUNTER — Encounter: Payer: Self-pay | Admitting: Internal Medicine

## 2013-09-16 DIAGNOSIS — M6530 Trigger finger, unspecified finger: Secondary | ICD-10-CM | POA: Diagnosis not present

## 2013-10-02 ENCOUNTER — Other Ambulatory Visit: Payer: Self-pay | Admitting: *Deleted

## 2013-10-02 MED ORDER — CYANOCOBALAMIN 1000 MCG/ML IJ SOLN: 1000.0000 ug | INTRAMUSCULAR | Status: DC

## 2013-10-02 NOTE — Telephone Encounter (Signed)
Left VM, needing refill on B12 injections to [facility]. Rx sent to pharmacy by escript

## 2013-12-01 ENCOUNTER — Encounter: Payer: Self-pay | Admitting: Internal Medicine

## 2013-12-01 DIAGNOSIS — E039 Hypothyroidism, unspecified: Secondary | ICD-10-CM

## 2013-12-04 ENCOUNTER — Other Ambulatory Visit (INDEPENDENT_AMBULATORY_CARE_PROVIDER_SITE_OTHER): Payer: Medicare Other

## 2013-12-04 DIAGNOSIS — E039 Hypothyroidism, unspecified: Secondary | ICD-10-CM

## 2013-12-04 LAB — T4, FREE: Free T4: 1.15 ng/dL (ref 0.60–1.60)

## 2013-12-04 LAB — TSH: TSH: 0.24 u[IU]/mL — ABNORMAL LOW (ref 0.35–5.50)

## 2013-12-07 ENCOUNTER — Telehealth: Payer: Self-pay | Admitting: *Deleted

## 2013-12-07 MED ORDER — LEVOTHYROXINE SODIUM 50 MCG PO TABS: 50.0000 ug | ORAL_TABLET | Freq: Every day | ORAL | Status: DC

## 2013-12-07 NOTE — Telephone Encounter (Signed)
Called for lab results from 12/04/13 please advise when you have a chance.

## 2013-12-07 NOTE — Telephone Encounter (Signed)
Patient notified and voiced understanding.

## 2013-12-07 NOTE — Telephone Encounter (Signed)
Thyroid function is overactive on current dose.  I have sent a lower dose of levothyroxine to her pharmacy and would like patient to change immediatley.  

## 2013-12-22 ENCOUNTER — Ambulatory Visit (INDEPENDENT_AMBULATORY_CARE_PROVIDER_SITE_OTHER): Payer: Medicare Other | Admitting: Internal Medicine

## 2013-12-22 ENCOUNTER — Encounter: Payer: Self-pay | Admitting: Internal Medicine

## 2013-12-22 ENCOUNTER — Ambulatory Visit: Payer: Medicare Other | Admitting: Internal Medicine

## 2013-12-22 ENCOUNTER — Telehealth: Payer: Self-pay | Admitting: Internal Medicine

## 2013-12-22 VITALS — BP 116/70 | HR 71 | Temp 98.1°F | Resp 16 | Wt 157.5 lb

## 2013-12-22 DIAGNOSIS — H109 Unspecified conjunctivitis: Secondary | ICD-10-CM | POA: Diagnosis not present

## 2013-12-22 DIAGNOSIS — Z20828 Contact with and (suspected) exposure to other viral communicable diseases: Secondary | ICD-10-CM | POA: Diagnosis not present

## 2013-12-22 DIAGNOSIS — H01009 Unspecified blepharitis unspecified eye, unspecified eyelid: Secondary | ICD-10-CM | POA: Diagnosis not present

## 2013-12-22 DIAGNOSIS — H01006 Unspecified blepharitis left eye, unspecified eyelid: Secondary | ICD-10-CM

## 2013-12-22 MED ORDER — POLYMYXIN B-TRIMETHOPRIM 10000-0.1 UNIT/ML-% OP SOLN: 2.0000 [drp] | Freq: Four times a day (QID) | OPHTHALMIC | Status: DC

## 2013-12-22 MED ORDER — OSELTAMIVIR PHOSPHATE 75 MG PO CAPS: 75.0000 mg | ORAL_CAPSULE | Freq: Every day | ORAL | Status: DC

## 2013-12-22 NOTE — Telephone Encounter (Signed)
Called pharmacy verified that directions were right for Tamiflu

## 2013-12-22 NOTE — Progress Notes (Signed)
Patient ID: Catherine Smith, female   DOB: Nov 30, 1940, 73 y.o.   MRN: [id number]  Patient Active Problem List   Diagnosis Date Noted  . Blepharitis of left eye 12/23/2013  . Exposure to influenza 12/23/2013  . Trigger finger, acquired 08/16/2013  . Medicare annual wellness visit, subsequent 08/16/2013  . Vitamin D deficiency 12/09/2012  . Unspecified hypothyroidism 12/09/2012  . History of back surgery 12/09/2012  . Pap smear for cervical cancer screening   . Screening for osteoporosis 08/15/2011  . Screening for breast cancer 08/15/2011  . Family history of colon cancer 08/15/2011  . Bimalleolar ankle fracture     Subjective:  CC:   Chief Complaint  Patient presents with  . Acute Visit    left eye swollen and itching    HPI:   Catherine Smith is a 73 y.o. female who presents for evaluation of swollen eyelid.   Left eyelid became entirely swollen and itchy approximately one week ago.  Eyelashes matted in the morning.  Swelling has  improved but still notes matting of eyelashes in the morning.    Sick contacts:  She has been staying with Husband has been hospitalized for bilateral pneumonia, then rehospitalized for urinary retention.  Her entire family has had the flu over the last 3 weeks.  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 Bimalleolar ankle fracture Feb 17 2011    surgery Dr. Kathyrn Sheriff and Sharlett Iles, [place]  . Hyperlipidemia   . Thyroid disease   . Screening for breast cancer 2011    normal  . Pap smear for cervical cancer screening 2010    normal    Past Surgical History  Procedure Laterality Date  . Spine surgery  1994    cervical disk 3through 6, [facility]  . Bunionectomy    . Trigger finger release  1984    left  hand  . Vaginal hysterectomy  1976  . Breast cyst excision  1970    benign  . Back surgery  07/15/12    Herniated Disc L5, Sliver of bone protruding into sciatic nerve       The following  portions of the patient's history were reviewed and updated as appropriate: Allergies, current medications, and problem list.    Review of Systems:   Patient denies headache, fevers, malaise, unintentional weight loss, skin rash, eye pain, sinus congestion and sinus pain, sore throat, dysphagia,  hemoptysis , cough, dyspnea, wheezing, chest pain, palpitations, orthopnea, edema, abdominal pain, nausea, melena, diarrhea, constipation, flank pain, dysuria, hematuria, urinary  Frequency, nocturia, numbness, tingling, seizures,  Focal weakness, Loss of consciousness,  Tremor, insomnia, depression, anxiety, and suicidal ideation.     History   Social History  . Marital Status: Married    Spouse Name: N/A    Number of Children: N/A  . Years of Education: N/A   Occupational History  . Not on file.   Social History Main Topics  . Smoking status: Never Smoker   . Smokeless tobacco: Never Used  . Alcohol Use: No  . Drug Use: No  . Sexual Activity: Not on file   Other Topics Concern  . Not on file   Social History Narrative  . No narrative on file    Objective:  Filed Vitals:   12/22/13 1344  BP: 116/70  Pulse: 71  Temp: 98.1 F (36.7 C)  Resp: 16     General appearance: alert, cooperative and appears stated age  Eyes: left eyelid swollen and red,  Conjunctiva normal.,   Ears: normal TM's and external ear canals both ears Throat: lips, mucosa, and tongue normal; teeth and gums normal Neck: no adenopathy, no carotid bruit, supple, symmetrical, trachea midline and thyroid not enlarged, symmetric, no tenderness/mass/nodules Back: symmetric, no curvature. ROM normal. No CVA tenderness. Lungs: clear to auscultation bilaterally Heart: regular rate and rhythm, S1, S2 normal, no murmur, click, rub or gallop Abdomen: soft, non-tender; bowel sounds normal; no masses,  no organomegaly Pulses: 2+ and symmetric Skin: Skin color, texture, turgor normal. No rashes or lesions Lymph nodes:  Cervical, supraclavicular, and axillary nodes normal.  Assessment and Plan:  Blepharitis of left eye Given exposure to MRSA from hospital contacts,  Will treat with polytrim. Patient advised to discard all eye makeup to avoid recontamination  Exposure to influenza tamiflu for post exposure prophylaxis advised and accepted,  Once daily dosing for 5 days    Updated Medication List Outpatient Encounter Prescriptions as of 12/22/2013  Medication Sig  . aspirin 81 MG tablet Take 81 mg by mouth daily.    . calcium citrate-vitamin D 200-200 MG-UNIT TABS Take 1 tablet by mouth 2 (two) times daily.    . cetirizine (ZYRTEC) 10 MG tablet Take 10 mg by mouth daily.    . Cholecalciferol (VITAMIN D3) 2000 UNITS TABS Take 1 tablet by mouth daily.  . cyanocobalamin (,VITAMIN B-12,) 1000 MCG/ML injection Inject 1 mL (1,000 mcg total) into the muscle every 30 (thirty) days.  Marland Kitchen levothyroxine (SYNTHROID) 50 MCG tablet Take 1 tablet (50 mcg total) by mouth daily before breakfast. TAKE ONE TABLET BY MOUTH EVERY MORNING  . magnesium oxide (MAG-OX) 400 MG tablet Take 400 mg by mouth daily.    . rosuvastatin (CRESTOR) 5 MG tablet Take 1 tablet (5 mg total) by mouth daily.  . TDaP (BOOSTRIX) 5-2.5-18.5 LF-MCG/0.5 injection Inject 0.5 mLs into the muscle once.  . zoster vaccine live, PF, (ZOSTAVAX) 02725 UNT/0.65ML injection Inject 19,400 Units into the skin once.  Marland Kitchen oseltamivir (TAMIFLU) 75 MG capsule Take 1 capsule (75 mg total) by mouth daily.  Marland Kitchen trimethoprim-polymyxin b (POLYTRIM) ophthalmic solution Place 2 drops into the left eye every 6 (six) hours.     No orders of the defined types were placed in this encounter.    No Follow-up on file.

## 2013-12-22 NOTE — Patient Instructions (Signed)
Take Tamiflu once daily for 5 days to prevent the flue  Use the eye drops in the left eye for one week

## 2013-12-22 NOTE — Progress Notes (Signed)
Pre-visit discussion using our clinic review tool. No additional management support is needed unless otherwise documented below in the visit note.  

## 2013-12-22 NOTE — Telephone Encounter (Signed)
Todd at [employer] called he has ? About rx just sent

## 2013-12-23 ENCOUNTER — Encounter: Payer: Self-pay | Admitting: Internal Medicine

## 2013-12-23 DIAGNOSIS — H01006 Unspecified blepharitis left eye, unspecified eyelid: Secondary | ICD-10-CM | POA: Insufficient documentation

## 2013-12-23 DIAGNOSIS — Z20828 Contact with and (suspected) exposure to other viral communicable diseases: Secondary | ICD-10-CM | POA: Insufficient documentation

## 2013-12-23 NOTE — Assessment & Plan Note (Signed)
tamiflu for post exposure prophylaxis advised and accepted,  Once daily dosing for 5 days

## 2013-12-23 NOTE — Assessment & Plan Note (Signed)
Given exposure to MRSA from hospital contacts,  Will treat with polytrim. Patient advised to discard all eye makeup to avoid recontamination

## 2014-03-09 ENCOUNTER — Other Ambulatory Visit: Payer: Self-pay | Admitting: *Deleted

## 2014-03-09 ENCOUNTER — Telehealth: Payer: Self-pay | Admitting: Internal Medicine

## 2014-03-09 MED ORDER — ROSUVASTATIN CALCIUM 5 MG PO TABS: ORAL_TABLET | ORAL | Status: DC

## 2014-03-09 NOTE — Telephone Encounter (Signed)
The patient is requesting a sleep study , because she is waking up snorting.

## 2014-03-10 NOTE — Telephone Encounter (Signed)
Needs appt,  i dont' order tests based on patient call ins unless we have discussed previosuly and we have not.

## 2014-03-10 NOTE — Telephone Encounter (Signed)
Please advise patient need appointment?

## 2014-03-10 NOTE — Telephone Encounter (Signed)
Left message, requesting pt to call and schedule an appointment with Dr. Derrel Nip to discuss sleep study.

## 2014-03-15 ENCOUNTER — Encounter: Payer: Self-pay | Admitting: Internal Medicine

## 2014-03-15 ENCOUNTER — Ambulatory Visit (INDEPENDENT_AMBULATORY_CARE_PROVIDER_SITE_OTHER): Payer: Medicare Other | Admitting: Internal Medicine

## 2014-03-15 VITALS — BP 118/64 | HR 73 | Temp 97.9°F | Resp 16 | Ht 64.1 in | Wt 147.8 lb

## 2014-03-15 DIAGNOSIS — R0989 Other specified symptoms and signs involving the circulatory and respiratory systems: Secondary | ICD-10-CM

## 2014-03-15 DIAGNOSIS — E663 Overweight: Secondary | ICD-10-CM

## 2014-03-15 DIAGNOSIS — E039 Hypothyroidism, unspecified: Secondary | ICD-10-CM

## 2014-03-15 DIAGNOSIS — R519 Headache, unspecified: Secondary | ICD-10-CM

## 2014-03-15 DIAGNOSIS — E559 Vitamin D deficiency, unspecified: Secondary | ICD-10-CM

## 2014-03-15 DIAGNOSIS — G4710 Hypersomnia, unspecified: Secondary | ICD-10-CM

## 2014-03-15 DIAGNOSIS — R0683 Snoring: Secondary | ICD-10-CM

## 2014-03-15 DIAGNOSIS — R0609 Other forms of dyspnea: Secondary | ICD-10-CM

## 2014-03-15 DIAGNOSIS — R51 Headache: Secondary | ICD-10-CM

## 2014-03-15 NOTE — Progress Notes (Signed)
Pre-visit discussion using our clinic review tool. No additional management support is needed unless otherwise documented below in the visit note.  

## 2014-03-15 NOTE — Progress Notes (Signed)
Patient ID: Catherine Smith, female   DOB: [birth year], 73 y.o.   MRN: [id number]   Patient Active Problem List   Diagnosis Date Noted  . Primary snoring 03/16/2014  . Blepharitis of left eye 12/23/2013  . Exposure to influenza 12/23/2013  . Trigger finger, acquired 08/16/2013  . Medicare annual wellness visit, subsequent 08/16/2013  . Vitamin D deficiency 12/09/2012  . Unspecified hypothyroidism 12/09/2012  . History of back surgery 12/09/2012  . Pap smear for cervical cancer screening   . Screening for osteoporosis 08/15/2011  . Screening for breast cancer 08/15/2011  . Family history of colon cancer 08/15/2011  . Bimalleolar ankle fracture     Subjective:  CC:   Chief Complaint  Patient presents with  . Acute Visit    patient stated she snorts when sleeping and wakes herslf up.    HPI:   Catherine Smith is a 73 y.o. female who presents for Excessive snoring and snorting .  Patient has a long history of snoring but her husband, who has OSA, is concerned about her excessive snoring, snorting and frequent wakenings as signs and sympotms of OSA.  Patient wakes up fatigued  Falls asleep easily.  History of overweight but has lost 10 lbs over the last 6 months using Weight Watchers and regular exercise. Marland Kitchen  No prior testing of OSA    Past Medical History  Diagnosis Date  . Bimalleolar ankle fracture Feb 17 2011    surgery Dr. Kathyrn Sheriff and Sharlett Iles, [place]  . Hyperlipidemia   . Thyroid disease   . Screening for breast cancer 2011    normal  . Pap smear for cervical cancer screening 2010    normal    Past Surgical History  Procedure Laterality Date  . Spine surgery  1994    cervical disk 3through 6, [facility]  . Bunionectomy    . Trigger finger release  1984    left  hand  . Vaginal hysterectomy  1976  . Breast cyst excision  1970    benign  . Back surgery  07/15/12    Herniated Disc L5, Sliver of bone protruding into sciatic nerve       The following portions  of the patient's history were reviewed and updated as appropriate: Allergies, current medications, and problem list.    Review of Systems:   Patient denies headache, fevers, malaise, unintentional weight loss, skin rash, eye pain, sinus congestion and sinus pain, sore throat, dysphagia,  hemoptysis , cough, dyspnea, wheezing, chest pain, palpitations, orthopnea, edema, abdominal pain, nausea, melena, diarrhea, constipation, flank pain, dysuria, hematuria, urinary  Frequency, nocturia, numbness, tingling, seizures,  Focal weakness, Loss of consciousness,  Tremor, insomnia, depression, anxiety, and suicidal ideation.     History   Social History  . Marital Status: Married    Spouse Name: N/A    Number of Children: N/A  . Years of Education: N/A   Occupational History  . Not on file.   Social History Main Topics  . Smoking status: Never Smoker   . Smokeless tobacco: Never Used  . Alcohol Use: No  . Drug Use: No  . Sexual Activity: Not on file   Other Topics Concern  . Not on file   Social History Narrative  . No narrative on file    Objective:  Filed Vitals:   03/15/14 1800  BP: 118/64  Pulse: 73  Temp: 97.9 F (36.6 C)  Resp: 16     General appearance: alert, cooperative and appears  stated age Ears: normal TM's and external ear canals both ears Throat: lips, mucosa, and tongue normal; teeth and gums normal Neck: no adenopathy, no carotid bruit, supple, symmetrical, trachea midline and thyroid not enlarged, symmetric, no tenderness/mass/nodules Back: symmetric, no curvature. ROM normal. No CVA tenderness. Lungs: clear to auscultation bilaterally Heart: regular rate and rhythm, S1, S2 normal, no murmur, click, rub or gallop Abdomen: soft, non-tender; bowel sounds normal; no masses,  no organomegaly Pulses: 2+ and symmetric Skin: Skin color, texture, turgor normal. No rashes or lesions Lymph nodes: Cervical, supraclavicular, and axillary nodes normal.  Assessment  and Plan:  Primary snoring With frequent wakenings, morning fatigue and occasionla morning headaches.  Referral to [facility] for sleep study to rule out OSA. Patient advised to call office one  the study is set up to request medication to take prn the night of the study  Overweight (BMI 25.0-29.9) I have congratulated her in reduction of   BMI and encouraged  Continued weight loss with goal of 10% of body weight over the next 6 months using a low glycemic index diet and regular exercise a minimum of 5 days per week.    Unspecified hypothyroidism Thyroid function was overactive in February and dose was reduced.  She will return for repeat TSH and fasting lipids   Updated Medication List Outpatient Encounter Prescriptions as of 03/15/2014  Medication Sig  . aspirin 81 MG tablet Take 81 mg by mouth daily.    . calcium citrate-vitamin D 200-200 MG-UNIT TABS Take 1 tablet by mouth 2 (two) times daily.    . cetirizine (ZYRTEC) 10 MG tablet Take 10 mg by mouth daily.    . Cholecalciferol (VITAMIN D3) 2000 UNITS TABS Take 1 tablet by mouth daily.  . cyanocobalamin (,VITAMIN B-12,) 1000 MCG/ML injection Inject 1 mL (1,000 mcg total) into the muscle every 30 (thirty) days.  Marland Kitchen levothyroxine (SYNTHROID) 50 MCG tablet Take 1 tablet (50 mcg total) by mouth daily before breakfast. TAKE ONE TABLET BY MOUTH EVERY MORNING  . magnesium oxide (MAG-OX) 400 MG tablet Take 400 mg by mouth daily.    . rosuvastatin (CRESTOR) 5 MG tablet Take 1 tablet (5 mg total) by mouth daily.  . [DISCONTINUED] oseltamivir (TAMIFLU) 75 MG capsule Take 1 capsule (75 mg total) by mouth daily.  . [DISCONTINUED] TDaP (BOOSTRIX) 5-2.5-18.5 LF-MCG/0.5 injection Inject 0.5 mLs into the muscle once.  . [DISCONTINUED] trimethoprim-polymyxin b (POLYTRIM) ophthalmic solution Place 2 drops into the left eye every 6 (six) hours.  . [DISCONTINUED] zoster vaccine live, PF, (ZOSTAVAX) 23557 UNT/0.65ML injection Inject 19,400 Units into the skin once.

## 2014-03-16 ENCOUNTER — Telehealth: Payer: Self-pay | Admitting: Internal Medicine

## 2014-03-16 ENCOUNTER — Encounter: Payer: Self-pay | Admitting: Internal Medicine

## 2014-03-16 DIAGNOSIS — R0683 Snoring: Secondary | ICD-10-CM | POA: Insufficient documentation

## 2014-03-16 DIAGNOSIS — E663 Overweight: Secondary | ICD-10-CM | POA: Insufficient documentation

## 2014-03-16 NOTE — Assessment & Plan Note (Addendum)
With frequent wakenings, morning fatigue and occasionla morning headaches.  Referral to [facility] for sleep study to rule out OSA. Patient advised to call office one  the study is set up to request medication to take prn the night of the study

## 2014-03-16 NOTE — Assessment & Plan Note (Signed)
Thyroid function was overactive in February and dose was reduced.  She will return for repeat TSH and fasting lipids

## 2014-03-16 NOTE — Assessment & Plan Note (Signed)
I have congratulated her in reduction of   BMI and encouraged  Continued weight loss with goal of 10% of body weight over the next 6 months using a low glycemic index diet and regular exercise a minimum of 5 days per week.     

## 2014-03-17 ENCOUNTER — Other Ambulatory Visit (INDEPENDENT_AMBULATORY_CARE_PROVIDER_SITE_OTHER): Payer: Medicare Other

## 2014-03-17 ENCOUNTER — Other Ambulatory Visit: Payer: Self-pay | Admitting: Internal Medicine

## 2014-03-17 DIAGNOSIS — E663 Overweight: Secondary | ICD-10-CM | POA: Diagnosis not present

## 2014-03-17 DIAGNOSIS — E039 Hypothyroidism, unspecified: Secondary | ICD-10-CM

## 2014-03-17 DIAGNOSIS — Z79899 Other long term (current) drug therapy: Secondary | ICD-10-CM

## 2014-03-17 DIAGNOSIS — E559 Vitamin D deficiency, unspecified: Secondary | ICD-10-CM

## 2014-03-17 LAB — LIPID PANEL
Cholesterol: 139 mg/dL (ref 0–200)
HDL: 50 mg/dL (ref >39.00)
LDL Cholesterol: 75 mg/dL (ref 0–99)
NONHDL: 89
Total CHOL/HDL Ratio: 3
Triglycerides: 68 mg/dL (ref 0.0–149.0)
VLDL: 13.6 mg/dL (ref 0.0–40.0)

## 2014-03-17 LAB — TSH: TSH: 2.85 u[IU]/mL (ref 0.35–4.50)

## 2014-03-18 LAB — VITAMIN D 25 HYDROXY (VIT D DEFICIENCY, FRACTURES): Vit D, 25-Hydroxy: 84 ng/mL (ref 30–89)

## 2014-03-19 NOTE — Addendum Note (Signed)
Addended by: Crecencio Mc on: 03/19/2014 10:08 AM   Modules accepted: Orders

## 2014-03-22 LAB — COMPREHENSIVE METABOLIC PANEL
ALBUMIN: 4.1 g/dL (ref 3.5–5.2)
ALT: 20 U/L (ref 0–35)
AST: 24 U/L (ref 0–37)
Alkaline Phosphatase: 60 U/L (ref 39–117)
BUN: 14 mg/dL (ref 6–23)
CALCIUM: 9.2 mg/dL (ref 8.4–10.5)
CHLORIDE: 105 meq/L (ref 96–112)
CO2: 26 mEq/L (ref 19–32)
CREATININE: 0.98 mg/dL (ref 0.50–1.10)
Glucose, Bld: 95 mg/dL (ref 70–99)
POTASSIUM: 4.3 meq/L (ref 3.5–5.3)
Sodium: 140 mEq/L (ref 135–145)
Total Bilirubin: 0.6 mg/dL (ref 0.2–1.2)
Total Protein: 6.5 g/dL (ref 6.0–8.3)

## 2014-03-24 ENCOUNTER — Encounter: Payer: Self-pay | Admitting: Internal Medicine

## 2014-06-08 ENCOUNTER — Other Ambulatory Visit: Payer: Self-pay | Admitting: *Deleted

## 2014-06-08 MED ORDER — LEVOTHYROXINE SODIUM 50 MCG PO TABS: 50.0000 ug | ORAL_TABLET | Freq: Every day | ORAL | Status: DC

## 2014-07-26 ENCOUNTER — Ambulatory Visit (INDEPENDENT_AMBULATORY_CARE_PROVIDER_SITE_OTHER): Payer: Medicare Other | Admitting: Internal Medicine

## 2014-07-26 ENCOUNTER — Encounter: Payer: Self-pay | Admitting: Internal Medicine

## 2014-07-26 VITALS — BP 116/68 | HR 72 | Temp 97.9°F | Resp 14 | Ht 64.1 in | Wt 141.0 lb

## 2014-07-26 DIAGNOSIS — E663 Overweight: Secondary | ICD-10-CM

## 2014-07-26 DIAGNOSIS — Z23 Encounter for immunization: Secondary | ICD-10-CM | POA: Diagnosis not present

## 2014-07-26 DIAGNOSIS — E785 Hyperlipidemia, unspecified: Secondary | ICD-10-CM

## 2014-07-26 DIAGNOSIS — L989 Disorder of the skin and subcutaneous tissue, unspecified: Secondary | ICD-10-CM

## 2014-07-26 DIAGNOSIS — E038 Other specified hypothyroidism: Secondary | ICD-10-CM

## 2014-07-26 DIAGNOSIS — Z79899 Other long term (current) drug therapy: Secondary | ICD-10-CM

## 2014-07-26 DIAGNOSIS — R0683 Snoring: Secondary | ICD-10-CM | POA: Diagnosis not present

## 2014-07-26 DIAGNOSIS — E034 Atrophy of thyroid (acquired): Secondary | ICD-10-CM

## 2014-07-26 NOTE — Assessment & Plan Note (Signed)
She has no side effects and liver enzymes were normal in June . No changes today

## 2014-07-26 NOTE — Patient Instructions (Addendum)
Referral to [facility] to have the skin growths looked at  If you want to test  Your cholsterol off of Crestor,  discontinue it 6 weeks before fasting blolod draw in December  We will test your thyroid again in December as

## 2014-07-26 NOTE — Assessment & Plan Note (Signed)
Difficult to say if these are SKs, AKs or basal cel CA, Referral to [facility]

## 2014-07-26 NOTE — Progress Notes (Signed)
Patient ID: Catherine Smith, female   DOB: 1941/09/09, 73 y.o.   MRN: [id number]   Patient Active Problem List   Diagnosis Date Noted  . Skin lesion of left lower limb 07/26/2014  . Hyperlipidemia with target LDL less than 100 07/26/2014  . Long-term use of high-risk medication 07/26/2014  . Primary snoring 03/16/2014  . Overweight (BMI 25.0-29.9) 03/16/2014  . Blepharitis of left eye 12/23/2013  . Exposure to influenza 12/23/2013  . Trigger finger, acquired 08/16/2013  . Medicare annual wellness visit, subsequent 08/16/2013  . Vitamin D deficiency 12/09/2012  . Unspecified hypothyroidism 12/09/2012  . History of back surgery 12/09/2012  . Pap smear for cervical cancer screening   . Screening for osteoporosis 08/15/2011  . Screening for breast cancer 08/15/2011  . Family history of colon cancer 08/15/2011  . Bimalleolar ankle fracture     Subjective:  CC:   Chief Complaint  Patient presents with  . Acute Visit    mole on left side of left leg on calf. nodule on left arm and mole on back    HPI:   [name] is a 73 y.o. female who presents for evaluation of  Several new skin lesions on both lower extremities,  Upper calf.  Present for several weeks ,  No itching or bleeding,  Have increased in size.  No prior trauma at site.   Overweight; patient is exercising daily and has lost 15 lbs over the past year  Using [employer]ak[employer] protein shakes  .  Feels great.  No joint issues,  Wonders if she still needs crestor for hyperlipidemia.  taking 5 mg daily    Past Medical History  Diagnosis Date  . Bimalleolar ankle fracture Feb 17 2011    surgery Dr. Kathyrn Sheriff and Sharlett Iles, [place]  . Hyperlipidemia   . Thyroid disease   . Screening for breast cancer 2011    normal  . Pap smear for cervical cancer screening 2010    normal    Past Surgical History  Procedure Laterality Date  . Spine surgery  1994    cervical disk 3through 6, [facility]  . Bunionectomy    . Trigger finger  release  1984    left  hand  . Vaginal hysterectomy  1976  . Breast cyst excision  1970    benign  . Back surgery  07/15/12    Herniated Disc L5, Sliver of bone protruding into sciatic nerve       The following portions of the patient's history were reviewed and updated as appropriate: Allergies, current medications, and problem list.    Review of Systems:   Patient denies headache, fevers, malaise, unintentional weight loss, skin rash, eye pain, sinus congestion and sinus pain, sore throat, dysphagia,  hemoptysis , cough, dyspnea, wheezing, chest pain, palpitations, orthopnea, edema, abdominal pain, nausea, melena, diarrhea, constipation, flank pain, dysuria, hematuria, urinary  Frequency, nocturia, numbness, tingling, seizures,  Focal weakness, Loss of consciousness,  Tremor, insomnia, depression, anxiety, and suicidal ideation.     History   Social History  . Marital Status: Married    Spouse Name: N/A    Number of Children: N/A  . Years of Education: N/A   Occupational History  . Not on file.   Social History Main Topics  . Smoking status: Never Smoker   . Smokeless tobacco: Never Used  . Alcohol Use: No  . Drug Use: No  . Sexual Activity: Not on file   Other Topics Concern  . Not  on file   Social History Narrative  . No narrative on file    Objective:  Filed Vitals:   07/26/14 1131  BP: 116/68  Pulse: 72  Temp: 97.9 F (36.6 C)  Resp: 14     General appearance: alert, cooperative and appears stated age Back: symmetric, no curvature. ROM normal. No CVA tenderness. Lungs: clear to auscultation bilaterally Heart: regular rate and rhythm, S1, S2 normal, no murmur, click, rub or gallop Abdomen: soft, non-tender; bowel sounds normal; no masses,  no organomegaly Pulses: 2+ and symmetric Skin: several macular tanned annular lesions on lateral calves suspicious for AK vs basal cell CA  Lymph nodes: Cervical, supraclavicular, and axillary nodes  normal.  Assessment and Plan:  Skin lesion of left lower limb Difficult to say if these are SKs, AKs or basal cel CA, Referral to [facility]  Overweight (BMI 25.0-29.9) I have congratulated her in reduction of   BMI and encouraged maintenance of weight using a low glycemic index diet and regular exercise a minimum of 5 days per week.    Primary snoring Sleep study was deferred by patient due to concern for medicare coverage.   Long-term use of high-risk medication She has no side effects and liver enzymes were normal in June . No changes today   Hyperlipidemia with target LDL less than 100 Patient wanting to test lipids off therapy since she is following a healthier lifestyle. .  Advised to suspend Crestor in November and return for labs in December    Updated Medication List Outpatient Encounter Prescriptions as of 07/26/2014  Medication Sig  . aspirin 81 MG tablet Take 81 mg by mouth daily.    . calcium citrate-vitamin D 200-200 MG-UNIT TABS Take 1 tablet by mouth 2 (two) times daily.    . cetirizine (ZYRTEC) 10 MG tablet Take 10 mg by mouth daily.    . Cholecalciferol (VITAMIN D3) 2000 UNITS TABS Take 1 tablet by mouth daily.  . cyanocobalamin (,VITAMIN B-12,) 1000 MCG/ML injection Inject 1 mL (1,000 mcg total) into the muscle every 30 (thirty) days.  Marland Kitchen levothyroxine (SYNTHROID, LEVOTHROID) 50 MCG tablet Take 1 tablet (50 mcg total) by mouth daily before breakfast. TAKE ONE TABLET BY MOUTH EVERY MORNING  . magnesium oxide (MAG-OX) 400 MG tablet Take 400 mg by mouth daily.    . rosuvastatin (CRESTOR) 5 MG tablet Take 1 tablet (5 mg total) by mouth daily.     Orders Placed This Encounter  Procedures  . Comprehensive metabolic panel  . Lipid panel  . TSH  . Ambulatory referral to Dermatology    No Follow-up on file.

## 2014-07-26 NOTE — Progress Notes (Signed)
Pre-visit discussion using our clinic review tool. No additional management support is needed unless otherwise documented below in the visit note.  

## 2014-07-26 NOTE — Assessment & Plan Note (Signed)
Sleep study was deferred by patient due to concern for medicare coverage.

## 2014-07-26 NOTE — Assessment & Plan Note (Signed)
Patient wanting to test lipids off therapy since she is following a healthier lifestyle. .  Advised to suspend Crestor in November and return for labs in December

## 2014-07-26 NOTE — Assessment & Plan Note (Signed)
I have congratulated her in reduction of   BMI and encouraged maintenance of weight using a low glycemic index diet and regular exercise a minimum of 5 days per week.

## 2014-08-27 ENCOUNTER — Other Ambulatory Visit: Payer: Self-pay | Admitting: *Deleted

## 2014-08-27 MED ORDER — CYANOCOBALAMIN 1000 MCG/ML IJ SOLN: 1000.0000 ug | INTRAMUSCULAR | Status: DC

## 2014-09-20 DIAGNOSIS — J019 Acute sinusitis, unspecified: Secondary | ICD-10-CM | POA: Diagnosis not present

## 2014-09-24 ENCOUNTER — Ambulatory Visit (INDEPENDENT_AMBULATORY_CARE_PROVIDER_SITE_OTHER): Payer: Medicare Other | Admitting: Nurse Practitioner

## 2014-09-24 ENCOUNTER — Encounter: Payer: Self-pay | Admitting: Nurse Practitioner

## 2014-09-24 VITALS — BP 131/77 | HR 94 | Temp 98.3°F | Resp 14 | Ht 64.1 in | Wt 141.0 lb

## 2014-09-24 DIAGNOSIS — R059 Cough, unspecified: Secondary | ICD-10-CM | POA: Insufficient documentation

## 2014-09-24 DIAGNOSIS — R05 Cough: Secondary | ICD-10-CM | POA: Diagnosis not present

## 2014-09-24 NOTE — Progress Notes (Signed)
Pre visit review using our clinic review tool, if applicable. No additional management support is needed unless otherwise documented below in the visit note. 

## 2014-09-24 NOTE — Progress Notes (Signed)
Subjective:    Patient ID: Catherine Smith, female    DOB: 06-11-1941, 73 y.o.   MRN: [id number]  HPI Catherine Smith 73 yo female here for fatigue coughing at night.   1) Amoxicillin 875 mg 5 days left after being seen at a walk in clinic. She is taking Zyrtec daily, mucinex, pseudafed, and drinking water.   Rhinorrhea-green/ Cough sputum- yellowish  Patient states she walks every morning for 45 min to one hour.  She is tired from doing usual activities and still does not feel fully improved.  She states she gets a tickle in her throat at night and then wakes up coughing.    Review of Systems  Constitutional: Positive for fatigue. Negative for fever, chills and diaphoresis.  HENT: Positive for congestion, postnasal drip, rhinorrhea and sinus pressure. Negative for ear discharge, ear pain, facial swelling, sneezing, sore throat, trouble swallowing and voice change.   Eyes: Negative for discharge and redness.  Respiratory: Positive for cough. Negative for chest tightness, shortness of breath and wheezing.   Cardiovascular: Negative for chest pain, palpitations and leg swelling.  Gastrointestinal: Negative for nausea, vomiting and diarrhea.  Musculoskeletal: Negative for myalgias, neck pain and neck stiffness.  Skin: Negative for rash.   Past Medical History  Diagnosis Date  . Bimalleolar ankle fracture Feb 17 2011    surgery Dr. Kathyrn Smith and Catherine Smith, [place]  . Hyperlipidemia   . Thyroid disease   . Screening for breast cancer 2011    normal  . Pap smear for cervical cancer screening 2010    normal    History   Social History  . Marital Status: Married    Spouse Name: N/A    Number of Children: N/A  . Years of Education: N/A   Occupational History  . Not on file.   Social History Main Topics  . Smoking status: Never Smoker   . Smokeless tobacco: Never Used  . Alcohol Use: No  . Drug Use: No  . Sexual Activity: Not on file   Other Topics Concern  . Not on file    Social History Narrative    Past Surgical History  Procedure Laterality Date  . Spine surgery  1994    cervical disk 3through 6, Catherine Smith  . Bunionectomy    . Trigger finger release  1984    left  hand  . Vaginal hysterectomy  1976  . Breast cyst excision  1970    benign  . Back surgery  07/15/12    Herniated Disc Catherine Smith    Family History  Problem Relation Age of Onset  . Cancer Mother 34    colon Ca   . Heart disease Mother   . Stroke Mother 17    died after CVA  . Heart disease Father 30    AMI, died at 83 of heart failure  . COPD Sister   . COPD Brother   . Heart disease Brother     No Known Allergies  Current Outpatient Prescriptions on File Prior to Visit  Medication Sig Dispense Refill  . aspirin 81 MG tablet Take 81 mg by mouth daily.      . cetirizine (ZYRTEC) 10 MG tablet Take 10 mg by mouth daily.      . Cholecalciferol (VITAMIN D3) 2000 UNITS TABS Take 1 tablet by mouth daily.    . cyanocobalamin (,VITAMIN B-12,) 1000 MCG/ML injection Inject 1 mL (1,000 mcg total) into the muscle  every 30 (thirty) days. 1 mL 11  . levothyroxine (SYNTHROID, LEVOTHROID) 50 MCG tablet Take 1 tablet (50 mcg total) by mouth daily before breakfast. TAKE ONE TABLET BY MOUTH EVERY MORNING 90 tablet 1  . magnesium oxide (MAG-OX) 400 MG tablet Take 400 mg by mouth daily.       No current facility-administered medications on file prior to visit.         Objective:   Physical Exam  Constitutional: She is oriented to person, place, and time. She appears well-developed and well-nourished. No distress.  HENT:  Head: Normocephalic and atraumatic.  Mouth/Throat: Oropharynx is clear and moist. No oropharyngeal exudate.  Eyes: Conjunctivae and EOM are normal. Pupils are equal, round, and reactive to light. Right eye exhibits no discharge. Left eye exhibits no discharge. No scleral icterus.  Neck: Normal range of motion. Neck supple. No  tracheal deviation present. No thyromegaly present.  Cardiovascular: Normal rate and regular rhythm.   Pulmonary/Chest: Effort normal and breath sounds normal. No respiratory distress. She has no wheezes. She has no rales. She exhibits no tenderness.  Lymphadenopathy:    She has no cervical adenopathy.  Neurological: She is alert and oriented to person, place, and time.  Skin: Skin is warm and dry. She is not diaphoretic.  Psychiatric: She has a normal mood and affect. Her behavior is normal. Judgment and thought content normal.     BP 131/77 mmHg  Pulse 94  Temp(Src) 98.3 F (36.8 C)  Resp 14  Ht 5' 4.1" (1.628 m)  Wt 141 lb (63.957 kg)  BMI 24.13 kg/m2  SpO2 98%     Assessment & Plan:

## 2014-09-24 NOTE — Patient Instructions (Addendum)
Skip morning walk and rest for a few days. Marland Kitchen  Post Nasal Drip and allergies can be treated with benadryl,  Allegra, Zyrtec and claritin. Benadryl is the most effective for drying you up but it is also the most sedating,  So try taking it at night and use one of the others in the daytime  Add Sudafed PE as needed for congestion  "DM" stands for dextromethorphan which is a cough suppressant.  The best/strongest available OTC cough suppressant is Delsym.  Consider using simply Saline to flush your sinuses twice daily when you have congestion to prevent sinus infections   Call us if you are not improving, worse, or get a fever or 101 or greater.   Sugar free jolly ranchers will be helpful for cough.

## 2014-09-24 NOTE — Assessment & Plan Note (Signed)
Pt was treated at a walk in clinic with amoxicillin 875 mg for 10 days she has 5 days left. Instructed her to take it easy, keep using mucinex, stay hydrated, benadryl OTC 25 mg at night to dry up secretions, and Delsym if still coughing. Instructed to call if symptoms fail to improve, worsen, or fever 101 or greater. FU prn.

## 2014-09-29 ENCOUNTER — Other Ambulatory Visit (INDEPENDENT_AMBULATORY_CARE_PROVIDER_SITE_OTHER): Payer: Medicare Other

## 2014-09-29 ENCOUNTER — Ambulatory Visit (INDEPENDENT_AMBULATORY_CARE_PROVIDER_SITE_OTHER): Payer: Medicare Other | Admitting: *Deleted

## 2014-09-29 DIAGNOSIS — E038 Other specified hypothyroidism: Secondary | ICD-10-CM

## 2014-09-29 DIAGNOSIS — E034 Atrophy of thyroid (acquired): Secondary | ICD-10-CM

## 2014-09-29 DIAGNOSIS — E785 Hyperlipidemia, unspecified: Secondary | ICD-10-CM | POA: Diagnosis not present

## 2014-09-29 DIAGNOSIS — Z79899 Other long term (current) drug therapy: Secondary | ICD-10-CM

## 2014-09-29 DIAGNOSIS — Z23 Encounter for immunization: Secondary | ICD-10-CM

## 2014-09-29 LAB — COMPREHENSIVE METABOLIC PANEL
ALK PHOS: 55 U/L (ref 39–117)
ALT: 30 U/L (ref 0–35)
AST: 36 U/L (ref 0–37)
Albumin: 3.9 g/dL (ref 3.5–5.2)
BUN: 16 mg/dL (ref 6–23)
CO2: 27 mEq/L (ref 19–32)
CREATININE: 1 mg/dL (ref 0.4–1.2)
Calcium: 8.8 mg/dL (ref 8.4–10.5)
Chloride: 105 mEq/L (ref 96–112)
GFR: 60.7 mL/min (ref >60.00)
Glucose, Bld: 92 mg/dL (ref 70–99)
Potassium: 4.4 mEq/L (ref 3.5–5.1)
Sodium: 137 mEq/L (ref 135–145)
Total Bilirubin: 0.4 mg/dL (ref 0.2–1.2)
Total Protein: 6.3 g/dL (ref 6.0–8.3)

## 2014-09-29 LAB — LIPID PANEL
CHOLESTEROL: 234 mg/dL — AB (ref 0–200)
HDL: 51.6 mg/dL (ref >39.00)
LDL CALC: 164 mg/dL — AB (ref 0–99)
NonHDL: 182.4
TRIGLYCERIDES: 94 mg/dL (ref 0.0–149.0)
Total CHOL/HDL Ratio: 5
VLDL: 18.8 mg/dL (ref 0.0–40.0)

## 2014-09-29 LAB — TSH: TSH: 4.67 u[IU]/mL — ABNORMAL HIGH (ref 0.35–4.50)

## 2014-10-03 ENCOUNTER — Encounter: Payer: Self-pay | Admitting: Internal Medicine

## 2014-10-03 DIAGNOSIS — E785 Hyperlipidemia, unspecified: Secondary | ICD-10-CM

## 2014-10-06 MED ORDER — SIMVASTATIN 20 MG PO TABS: 20.0000 mg | ORAL_TABLET | Freq: Every day | ORAL | Status: DC

## 2014-11-03 DIAGNOSIS — D225 Melanocytic nevi of trunk: Secondary | ICD-10-CM | POA: Diagnosis not present

## 2014-11-03 DIAGNOSIS — L821 Other seborrheic keratosis: Secondary | ICD-10-CM | POA: Diagnosis not present

## 2014-11-03 DIAGNOSIS — D2272 Melanocytic nevi of left lower limb, including hip: Secondary | ICD-10-CM | POA: Diagnosis not present

## 2014-11-03 DIAGNOSIS — D2262 Melanocytic nevi of left upper limb, including shoulder: Secondary | ICD-10-CM | POA: Diagnosis not present

## 2014-11-15 DIAGNOSIS — H2513 Age-related nuclear cataract, bilateral: Secondary | ICD-10-CM | POA: Diagnosis not present

## 2014-12-29 ENCOUNTER — Encounter: Payer: Self-pay | Admitting: Internal Medicine

## 2014-12-29 ENCOUNTER — Other Ambulatory Visit: Payer: Self-pay | Admitting: *Deleted

## 2014-12-29 MED ORDER — LEVOTHYROXINE SODIUM 50 MCG PO TABS: 50.0000 ug | ORAL_TABLET | Freq: Every day | ORAL | Status: DC

## 2014-12-30 NOTE — Telephone Encounter (Signed)
Please advise 

## 2015-06-14 ENCOUNTER — Encounter: Payer: Self-pay | Admitting: Internal Medicine

## 2015-06-15 ENCOUNTER — Other Ambulatory Visit: Payer: Self-pay | Admitting: Internal Medicine

## 2015-06-15 DIAGNOSIS — Z1239 Encounter for other screening for malignant neoplasm of breast: Secondary | ICD-10-CM

## 2015-06-15 DIAGNOSIS — R5383 Other fatigue: Secondary | ICD-10-CM

## 2015-06-15 DIAGNOSIS — E785 Hyperlipidemia, unspecified: Secondary | ICD-10-CM

## 2015-06-16 ENCOUNTER — Other Ambulatory Visit (INDEPENDENT_AMBULATORY_CARE_PROVIDER_SITE_OTHER): Payer: Medicare Other

## 2015-06-16 DIAGNOSIS — R5383 Other fatigue: Secondary | ICD-10-CM | POA: Diagnosis not present

## 2015-06-16 DIAGNOSIS — E785 Hyperlipidemia, unspecified: Secondary | ICD-10-CM

## 2015-06-16 LAB — CBC WITH DIFFERENTIAL/PLATELET
BASOS ABS: 0 10*3/uL (ref 0.0–0.1)
Basophils Relative: 0.6 % (ref 0.0–3.0)
EOS ABS: 0.3 10*3/uL (ref 0.0–0.7)
Eosinophils Relative: 5.8 % — ABNORMAL HIGH (ref 0.0–5.0)
HCT: 41.1 % (ref 36.0–46.0)
Hemoglobin: 13.8 g/dL (ref 12.0–15.0)
LYMPHS ABS: 2.2 10*3/uL (ref 0.7–4.0)
LYMPHS PCT: 37.8 % (ref 12.0–46.0)
MCHC: 33.5 g/dL (ref 30.0–36.0)
MCV: 95.1 fl (ref 78.0–100.0)
MONO ABS: 0.5 10*3/uL (ref 0.1–1.0)
Monocytes Relative: 8.4 % (ref 3.0–12.0)
NEUTROS ABS: 2.7 10*3/uL (ref 1.4–7.7)
Neutrophils Relative %: 47.4 % (ref 43.0–77.0)
PLATELETS: 240 10*3/uL (ref 150.0–400.0)
RBC: 4.32 Mil/uL (ref 3.87–5.11)
RDW: 14.4 % (ref 11.5–15.5)
WBC: 5.7 10*3/uL (ref 4.0–10.5)

## 2015-06-16 LAB — COMPREHENSIVE METABOLIC PANEL
ALT: 19 U/L (ref 0–35)
AST: 28 U/L (ref 0–37)
Albumin: 4.1 g/dL (ref 3.5–5.2)
Alkaline Phosphatase: 52 U/L (ref 39–117)
BILIRUBIN TOTAL: 0.6 mg/dL (ref 0.2–1.2)
BUN: 16 mg/dL (ref 6–23)
CO2: 30 meq/L (ref 19–32)
CREATININE: 1.05 mg/dL (ref 0.40–1.20)
Calcium: 9.2 mg/dL (ref 8.4–10.5)
Chloride: 101 mEq/L (ref 96–112)
GFR: 54.62 mL/min — ABNORMAL LOW (ref >60.00)
GLUCOSE: 87 mg/dL (ref 70–99)
Potassium: 4.3 mEq/L (ref 3.5–5.1)
SODIUM: 137 meq/L (ref 135–145)
Total Protein: 6.7 g/dL (ref 6.0–8.3)

## 2015-06-16 LAB — LIPID PANEL
CHOL/HDL RATIO: 3
Cholesterol: 184 mg/dL (ref 0–200)
HDL: 57.2 mg/dL (ref >39.00)
LDL CALC: 112 mg/dL — AB (ref 0–99)
NonHDL: 126.62
Triglycerides: 73 mg/dL (ref 0.0–149.0)
VLDL: 14.6 mg/dL (ref 0.0–40.0)

## 2015-06-20 ENCOUNTER — Encounter: Payer: Self-pay | Admitting: Internal Medicine

## 2015-06-20 ENCOUNTER — Other Ambulatory Visit: Payer: Self-pay | Admitting: Internal Medicine

## 2015-06-20 DIAGNOSIS — E034 Atrophy of thyroid (acquired): Secondary | ICD-10-CM

## 2015-06-30 ENCOUNTER — Other Ambulatory Visit: Payer: Medicare Other

## 2015-07-05 ENCOUNTER — Ambulatory Visit (INDEPENDENT_AMBULATORY_CARE_PROVIDER_SITE_OTHER): Payer: Medicare Other

## 2015-07-05 DIAGNOSIS — Z23 Encounter for immunization: Secondary | ICD-10-CM

## 2015-08-01 ENCOUNTER — Encounter: Payer: Self-pay | Admitting: Internal Medicine

## 2015-08-12 ENCOUNTER — Telehealth: Payer: Self-pay

## 2015-08-12 DIAGNOSIS — R002 Palpitations: Secondary | ICD-10-CM

## 2015-08-12 DIAGNOSIS — E034 Atrophy of thyroid (acquired): Secondary | ICD-10-CM

## 2015-08-12 DIAGNOSIS — E785 Hyperlipidemia, unspecified: Secondary | ICD-10-CM

## 2015-08-12 DIAGNOSIS — R0600 Dyspnea, unspecified: Secondary | ICD-10-CM

## 2015-08-12 NOTE — Telephone Encounter (Signed)
Spoke with patient and she stated that she was have palpitations, some shortness of breath when she gets up, and her heart rate was up yesterday 08/11/15 during exercise class. I told her with the symptoms that she was giving me that she needed to be evaluated and with it being 4:50 PM on Friday afternoon that she should go to the ER to be evaluated. She ask if she could go to [facility] instead. Told her that with the symptoms that they probably send her to the ER, but to at least try. I explained several times that she needs to be evaluated somewhere this weekend.

## 2015-08-12 NOTE — Telephone Encounter (Signed)
Patient called triage line, concerned that she is having heart palpitations.  I attempted to call the patient back, left a message to return my call.

## 2015-08-13 NOTE — Telephone Encounter (Signed)
Pleaase See both messages from oct 29 before calling patinet

## 2015-08-13 NOTE — Addendum Note (Signed)
Addended by: Crecencio Mc on: 08/13/2015 02:07 PM   Modules accepted: Orders

## 2015-08-13 NOTE — Telephone Encounter (Signed)
Looks like she did not go to ER or [facility] so please call her on Monday and offer her an appt at 5 pm.  She will need an EKG, and if she can come by in the early AM to have some labs done,  They might be back by the time i see her.

## 2015-08-13 NOTE — Telephone Encounter (Signed)
Also ask her ot get a chest x ray that morning.  All have been ordered.

## 2015-08-15 NOTE — Telephone Encounter (Signed)
Left message for patien to return call to office. 

## 2015-08-16 ENCOUNTER — Telehealth: Payer: Self-pay

## 2015-08-16 ENCOUNTER — Ambulatory Visit
Admission: RE | Admit: 2015-08-16 | Discharge: 2015-08-16 | Disposition: A | Discharge status: Home / Self Care | Payer: Medicare Other | Source: Ambulatory Visit | Attending: Internal Medicine | Admitting: Internal Medicine

## 2015-08-16 ENCOUNTER — Ambulatory Visit (INDEPENDENT_AMBULATORY_CARE_PROVIDER_SITE_OTHER): Payer: Medicare Other | Admitting: Family Medicine

## 2015-08-16 ENCOUNTER — Encounter: Payer: Self-pay | Admitting: Family Medicine

## 2015-08-16 ENCOUNTER — Other Ambulatory Visit (INDEPENDENT_AMBULATORY_CARE_PROVIDER_SITE_OTHER): Payer: Medicare Other

## 2015-08-16 VITALS — BP 130/82 | HR 72 | Temp 98.7°F | Ht 64.1 in | Wt 141.5 lb

## 2015-08-16 DIAGNOSIS — R05 Cough: Secondary | ICD-10-CM | POA: Diagnosis not present

## 2015-08-16 DIAGNOSIS — E038 Other specified hypothyroidism: Secondary | ICD-10-CM

## 2015-08-16 DIAGNOSIS — R002 Palpitations: Secondary | ICD-10-CM

## 2015-08-16 DIAGNOSIS — E785 Hyperlipidemia, unspecified: Secondary | ICD-10-CM | POA: Diagnosis not present

## 2015-08-16 DIAGNOSIS — R0609 Other forms of dyspnea: Secondary | ICD-10-CM | POA: Insufficient documentation

## 2015-08-16 DIAGNOSIS — R0600 Dyspnea, unspecified: Secondary | ICD-10-CM

## 2015-08-16 DIAGNOSIS — E034 Atrophy of thyroid (acquired): Secondary | ICD-10-CM | POA: Diagnosis not present

## 2015-08-16 LAB — LIPID PANEL
CHOL/HDL RATIO: 3
Cholesterol: 180 mg/dL (ref 0–200)
HDL: 62.8 mg/dL (ref >39.00)
LDL Cholesterol: 101 mg/dL — ABNORMAL HIGH (ref 0–99)
NONHDL: 117.51
TRIGLYCERIDES: 83 mg/dL (ref 0.0–149.0)
VLDL: 16.6 mg/dL (ref 0.0–40.0)

## 2015-08-16 LAB — COMPREHENSIVE METABOLIC PANEL
ALBUMIN: 4 g/dL (ref 3.5–5.2)
ALK PHOS: 58 U/L (ref 39–117)
ALT: 17 U/L (ref 0–35)
AST: 25 U/L (ref 0–37)
BUN: 17 mg/dL (ref 6–23)
CHLORIDE: 101 meq/L (ref 96–112)
CO2: 28 mEq/L (ref 19–32)
Calcium: 9.4 mg/dL (ref 8.4–10.5)
Creatinine, Ser: 1.02 mg/dL (ref 0.40–1.20)
GFR: 56.46 mL/min — AB (ref >60.00)
Glucose, Bld: 88 mg/dL (ref 70–99)
POTASSIUM: 4.3 meq/L (ref 3.5–5.1)
SODIUM: 136 meq/L (ref 135–145)
Total Bilirubin: 0.7 mg/dL (ref 0.2–1.2)
Total Protein: 6.8 g/dL (ref 6.0–8.3)

## 2015-08-16 LAB — CBC WITH DIFFERENTIAL/PLATELET
BASOS ABS: 0 10*3/uL (ref 0.0–0.1)
Basophils Relative: 0.6 % (ref 0.0–3.0)
Eosinophils Absolute: 0.4 10*3/uL (ref 0.0–0.7)
Eosinophils Relative: 6.4 % — ABNORMAL HIGH (ref 0.0–5.0)
HEMATOCRIT: 42.1 % (ref 36.0–46.0)
Hemoglobin: 14 g/dL (ref 12.0–15.0)
LYMPHS PCT: 34.8 % (ref 12.0–46.0)
Lymphs Abs: 2 10*3/uL (ref 0.7–4.0)
MCHC: 33.3 g/dL (ref 30.0–36.0)
MCV: 94.4 fl (ref 78.0–100.0)
MONOS PCT: 7.7 % (ref 3.0–12.0)
Monocytes Absolute: 0.4 10*3/uL (ref 0.1–1.0)
NEUTROS ABS: 2.8 10*3/uL (ref 1.4–7.7)
Neutrophils Relative %: 50.5 % (ref 43.0–77.0)
Platelets: 224 10*3/uL (ref 150.0–400.0)
RBC: 4.46 Mil/uL (ref 3.87–5.11)
RDW: 13.9 % (ref 11.5–15.5)
WBC: 5.6 10*3/uL (ref 4.0–10.5)

## 2015-08-16 LAB — TSH: TSH: 4.1 u[IU]/mL (ref 0.35–4.50)

## 2015-08-16 LAB — MAGNESIUM: MAGNESIUM: 2.1 mg/dL (ref 1.5–2.5)

## 2015-08-16 NOTE — Assessment & Plan Note (Signed)
Sinus tachycardia vs SVT. Labs are remarkable and chest x-ray negative today. EKG normal. Discussed watchful waiting versus referral for Holter monitor. Patient is going to pursue watchful waiting at this time. If recurs she would like referral.

## 2015-08-16 NOTE — Patient Instructions (Signed)
Call if it happens again or you have worrisome symptoms (SOB, chest pain, feeling as if you're going to pass out, etc.)  Take care  Dr. Lacinda Axon

## 2015-08-16 NOTE — Progress Notes (Signed)
Subjective:  Patient ID: Catherine Smith, female    DOB: [birth year]  Age: 74 y.o. MRN: [id number]  CC: Palpitations  HPI:  74 year old female with a past medical history of hypothyroidism, HLD presents to the clinic with complaints of recent palpitations.  Palpitations  Patient reports that she had palpitations last Wednesday while exercising at the gym.  She reports her heart rate was faster than expected and that it continued for approximately 1 min (despite rest).  No associated chest pain or SOB. No reports of feeling faint/presyncope.  Patient called our office following and was urged to be seen but did not do so.  She presents today for evaluation.  No further episodes of palpitations.  Social Hx   Social History   Social History  . Marital Status: Married    Spouse Name: N/A  . Number of Children: N/A  . Years of Education: N/A   Social History Main Topics  . Smoking status: Never Smoker   . Smokeless tobacco: Never Used  . Alcohol Use: No  . Drug Use: No  . Sexual Activity: Not Asked   Other Topics Concern  . None   Social History Narrative    Review of Systems  Constitutional: Negative.   Respiratory: Negative for shortness of breath.   Cardiovascular: Positive for palpitations. Negative for chest pain.   Objective:  BP 130/82 mmHg  Pulse 72  Temp(Src) 98.7 F (37.1 C) (Oral)  Ht 5' 4.1" (1.628 m)  Wt 141 lb 8 oz (64.184 kg)  BMI 24.22 kg/m2  SpO2 98%  BP/Weight 08/16/2015 09/24/2014 05/28/4817  Systolic BP 563 149 702  Diastolic BP 82 77 68  Wt. (Lbs) 141.5 141 141  BMI 24.22 24.13 24.13    Physical Exam  Constitutional: She appears well-developed. No distress.  HENT:  Head: Normocephalic and atraumatic.  Cardiovascular: Normal rate and regular rhythm.   No murmur heard. Pulmonary/Chest: Effort normal and breath sounds normal. No respiratory distress. She has no wheezes. She has no rales.  Neurological: She is alert.  Psychiatric: She  has a normal mood and affect.  Vitals reviewed.  Lab Results  Component Value Date   WBC 5.6 08/16/2015   HGB 14.0 08/16/2015   HCT 42.1 08/16/2015   PLT 224.0 08/16/2015   GLUCOSE 88 08/16/2015   CHOL 180 08/16/2015   TRIG 83.0 08/16/2015   HDL 62.80 08/16/2015   LDLDIRECT 110.6 12/09/2012   LDLCALC 101* 08/16/2015   ALT 17 08/16/2015   AST 25 08/16/2015   NA 136 08/16/2015   K 4.3 08/16/2015   CL 101 08/16/2015   CREATININE 1.02 08/16/2015   BUN 17 08/16/2015   CO2 28 08/16/2015   TSH 4.10 08/16/2015   EKG: normal EKG, normal sinus rhythm. No signs of ischemia.  Dg Chest 2 View  08/16/2015  CLINICAL DATA:  Heart palpitations.  Cough. EXAM: CHEST  2 VIEW COMPARISON:  None. FINDINGS: Mediastinum and hilar structures are normal. Lungs are clear. No pleural effusion or pneumothorax. Degenerative changes thoracic spine. IMPRESSION: No acute cardiopulmonary disease. Electronically Signed   By: Marcello Moores  Register   On: 08/16/2015 13:43   Assessment & Plan:   Problem List Items Addressed This Visit    Palpitations - Primary    Sinus tachycardia vs SVT. Labs are remarkable and chest x-ray negative today. EKG normal. Discussed watchful waiting versus referral for Holter monitor. Patient is going to pursue watchful waiting at this time. If recurs she would like referral.  Relevant Orders   EKG 12-Lead (Completed)     Follow-up: PRN  Thersa Salt, DO

## 2015-08-16 NOTE — Telephone Encounter (Signed)
Patient scheduled with dr. Lacinda Axon at 4 pm and lab this AM patient also to go for Chest x-ray at [facility] before comming to office Visit with MD all ordered by MD. Juluis Rainier

## 2015-08-16 NOTE — Progress Notes (Signed)
Pre visit review using our clinic review tool, if applicable. No additional management support is needed unless otherwise documented below in the visit note. 

## 2015-08-16 NOTE — Telephone Encounter (Signed)
error 

## 2015-08-24 ENCOUNTER — Other Ambulatory Visit: Payer: Self-pay

## 2015-08-24 MED ORDER — LEVOTHYROXINE SODIUM 50 MCG PO TABS: 50.0000 ug | ORAL_TABLET | Freq: Every day | ORAL | Status: DC

## 2015-09-06 ENCOUNTER — Other Ambulatory Visit: Payer: Self-pay

## 2015-09-06 MED ORDER — CYANOCOBALAMIN 1000 MCG/ML IJ SOLN: 1000.0000 ug | INTRAMUSCULAR | Status: DC

## 2015-10-04 ENCOUNTER — Other Ambulatory Visit: Payer: Self-pay

## 2015-10-04 DIAGNOSIS — E785 Hyperlipidemia, unspecified: Secondary | ICD-10-CM

## 2015-10-04 MED ORDER — SIMVASTATIN 20 MG PO TABS: 20.0000 mg | ORAL_TABLET | Freq: Every day | ORAL | Status: DC

## 2015-10-12 ENCOUNTER — Encounter: Payer: Self-pay | Admitting: *Deleted

## 2015-10-20 ENCOUNTER — Other Ambulatory Visit: Payer: Self-pay | Admitting: Internal Medicine

## 2015-10-20 ENCOUNTER — Ambulatory Visit
Admission: RE | Admit: 2015-10-20 | Discharge: 2015-10-20 | Disposition: A | Discharge status: Home / Self Care | Payer: Medicare Other | Source: Ambulatory Visit | Attending: Internal Medicine | Admitting: Internal Medicine

## 2015-10-20 DIAGNOSIS — Z1231 Encounter for screening mammogram for malignant neoplasm of breast: Secondary | ICD-10-CM | POA: Insufficient documentation

## 2015-10-20 DIAGNOSIS — Z1239 Encounter for other screening for malignant neoplasm of breast: Secondary | ICD-10-CM

## 2015-11-04 ENCOUNTER — Other Ambulatory Visit: Payer: Self-pay

## 2015-11-04 MED ORDER — CYANOCOBALAMIN 1000 MCG/ML IJ SOLN: 1000.0000 ug | INTRAMUSCULAR | Status: DC

## 2015-12-07 ENCOUNTER — Telehealth: Payer: Self-pay | Admitting: Internal Medicine

## 2015-12-07 NOTE — Telephone Encounter (Signed)
Left msg to call back to schedule AWV with Denisa/msn

## 2015-12-20 DIAGNOSIS — H01003 Unspecified blepharitis right eye, unspecified eyelid: Secondary | ICD-10-CM | POA: Diagnosis not present

## 2016-01-13 DIAGNOSIS — H2513 Age-related nuclear cataract, bilateral: Secondary | ICD-10-CM | POA: Diagnosis not present

## 2016-02-23 ENCOUNTER — Other Ambulatory Visit: Payer: Self-pay

## 2016-02-23 MED ORDER — LEVOTHYROXINE SODIUM 50 MCG PO TABS: 50.0000 ug | ORAL_TABLET | Freq: Every day | ORAL | Status: DC

## 2016-03-08 ENCOUNTER — Telehealth: Payer: Self-pay | Admitting: *Deleted

## 2016-03-14 DIAGNOSIS — Q6621 Congenital metatarsus primus varus: Secondary | ICD-10-CM | POA: Diagnosis not present

## 2016-03-14 DIAGNOSIS — M79671 Pain in right foot: Secondary | ICD-10-CM | POA: Diagnosis not present

## 2016-03-14 DIAGNOSIS — M2021 Hallux rigidus, right foot: Secondary | ICD-10-CM | POA: Diagnosis not present

## 2016-04-04 DIAGNOSIS — M2021 Hallux rigidus, right foot: Secondary | ICD-10-CM | POA: Diagnosis not present

## 2016-04-12 ENCOUNTER — Encounter: Payer: Medicare Other | Admitting: Internal Medicine

## 2016-06-29 DIAGNOSIS — E538 Deficiency of other specified B group vitamins: Secondary | ICD-10-CM | POA: Diagnosis not present

## 2016-06-29 DIAGNOSIS — M8589 Other specified disorders of bone density and structure, multiple sites: Secondary | ICD-10-CM | POA: Diagnosis not present

## 2016-06-29 DIAGNOSIS — E039 Hypothyroidism, unspecified: Secondary | ICD-10-CM | POA: Diagnosis not present

## 2016-06-29 DIAGNOSIS — Z23 Encounter for immunization: Secondary | ICD-10-CM | POA: Diagnosis not present

## 2016-06-29 DIAGNOSIS — E7800 Pure hypercholesterolemia, unspecified: Secondary | ICD-10-CM | POA: Diagnosis not present

## 2016-06-29 DIAGNOSIS — Z1239 Encounter for other screening for malignant neoplasm of breast: Secondary | ICD-10-CM | POA: Diagnosis not present

## 2016-06-29 DIAGNOSIS — Z79899 Other long term (current) drug therapy: Secondary | ICD-10-CM | POA: Diagnosis not present

## 2016-06-29 DIAGNOSIS — E559 Vitamin D deficiency, unspecified: Secondary | ICD-10-CM | POA: Diagnosis not present

## 2016-07-03 DIAGNOSIS — E559 Vitamin D deficiency, unspecified: Secondary | ICD-10-CM | POA: Diagnosis not present

## 2016-07-03 DIAGNOSIS — E039 Hypothyroidism, unspecified: Secondary | ICD-10-CM | POA: Diagnosis not present

## 2016-07-03 DIAGNOSIS — Z79899 Other long term (current) drug therapy: Secondary | ICD-10-CM | POA: Diagnosis not present

## 2016-07-03 DIAGNOSIS — E7800 Pure hypercholesterolemia, unspecified: Secondary | ICD-10-CM | POA: Diagnosis not present

## 2016-07-03 DIAGNOSIS — E538 Deficiency of other specified B group vitamins: Secondary | ICD-10-CM | POA: Diagnosis not present

## 2016-07-23 DIAGNOSIS — M8589 Other specified disorders of bone density and structure, multiple sites: Secondary | ICD-10-CM | POA: Diagnosis not present

## 2016-08-21 ENCOUNTER — Other Ambulatory Visit: Payer: Self-pay | Admitting: Internal Medicine

## 2016-08-21 NOTE — Telephone Encounter (Signed)
Pt hasn't been seen in over a year. Cancelled CPE in June. No future appt. Okay to refill thyroid medication.

## 2016-09-12 ENCOUNTER — Telehealth: Payer: Self-pay | Admitting: Internal Medicine

## 2016-09-12 NOTE — Telephone Encounter (Signed)
Pt has declined to get AWV. Thank you!

## 2016-09-14 ENCOUNTER — Other Ambulatory Visit: Payer: Self-pay | Admitting: Internal Medicine

## 2016-09-17 NOTE — Telephone Encounter (Signed)
Patient not seen since 08/16/2015 ok t fill? No future scheduled.

## 2016-10-16 ENCOUNTER — Other Ambulatory Visit: Payer: Self-pay | Admitting: Internal Medicine

## 2016-10-16 DIAGNOSIS — E785 Hyperlipidemia, unspecified: Secondary | ICD-10-CM

## 2016-10-16 NOTE — Telephone Encounter (Signed)
Pt last refill on simvastatin was on 07/19/16 and last OV was on 08/16/15 with Dr. Lacinda Axon. Pt has no scheduled appts at this time. Ok to refill?

## 2016-10-16 NOTE — Telephone Encounter (Signed)
No refills on statins until liver enzymes have been checked .  They MUST be checked every 6 months.   labs (fasting ) ordered.

## 2016-10-19 NOTE — Telephone Encounter (Signed)
Spoke with patient she states she is seeing provider at [facility].

## 2016-11-07 ENCOUNTER — Other Ambulatory Visit: Payer: Self-pay | Admitting: Internal Medicine

## 2016-11-07 DIAGNOSIS — Z1231 Encounter for screening mammogram for malignant neoplasm of breast: Secondary | ICD-10-CM

## 2016-12-05 ENCOUNTER — Ambulatory Visit
Admission: RE | Admit: 2016-12-05 | Discharge: 2016-12-05 | Disposition: A | Discharge status: Home / Self Care | Payer: Medicare Other | Source: Ambulatory Visit | Attending: Internal Medicine | Admitting: Internal Medicine

## 2016-12-05 DIAGNOSIS — Z1231 Encounter for screening mammogram for malignant neoplasm of breast: Secondary | ICD-10-CM | POA: Diagnosis not present

## 2017-02-28 DIAGNOSIS — E7800 Pure hypercholesterolemia, unspecified: Secondary | ICD-10-CM | POA: Diagnosis not present

## 2017-02-28 DIAGNOSIS — Z9889 Other specified postprocedural states: Secondary | ICD-10-CM | POA: Diagnosis not present

## 2017-02-28 DIAGNOSIS — T148XXA Other injury of unspecified body region, initial encounter: Secondary | ICD-10-CM | POA: Diagnosis not present

## 2017-02-28 DIAGNOSIS — S161XXA Strain of muscle, fascia and tendon at neck level, initial encounter: Secondary | ICD-10-CM | POA: Diagnosis not present

## 2017-02-28 DIAGNOSIS — Z90710 Acquired absence of both cervix and uterus: Secondary | ICD-10-CM | POA: Diagnosis not present

## 2017-02-28 DIAGNOSIS — Y92410 Unspecified street and highway as the place of occurrence of the external cause: Secondary | ICD-10-CM | POA: Diagnosis not present

## 2017-02-28 DIAGNOSIS — M79603 Pain in arm, unspecified: Secondary | ICD-10-CM | POA: Diagnosis not present

## 2017-03-05 ENCOUNTER — Telehealth: Payer: Self-pay | Admitting: Internal Medicine

## 2017-03-05 NOTE — Telephone Encounter (Signed)
Pt declined AWV. °

## 2017-03-06 DIAGNOSIS — E039 Hypothyroidism, unspecified: Secondary | ICD-10-CM | POA: Diagnosis not present

## 2017-03-06 DIAGNOSIS — E538 Deficiency of other specified B group vitamins: Secondary | ICD-10-CM | POA: Diagnosis not present

## 2017-03-06 DIAGNOSIS — M542 Cervicalgia: Secondary | ICD-10-CM | POA: Diagnosis not present

## 2017-03-06 DIAGNOSIS — E559 Vitamin D deficiency, unspecified: Secondary | ICD-10-CM | POA: Diagnosis not present

## 2017-03-06 DIAGNOSIS — E782 Mixed hyperlipidemia: Secondary | ICD-10-CM | POA: Diagnosis not present

## 2017-03-06 DIAGNOSIS — M25511 Pain in right shoulder: Secondary | ICD-10-CM | POA: Diagnosis not present

## 2017-03-06 DIAGNOSIS — M47812 Spondylosis without myelopathy or radiculopathy, cervical region: Secondary | ICD-10-CM | POA: Diagnosis not present

## 2017-03-06 DIAGNOSIS — Z79899 Other long term (current) drug therapy: Secondary | ICD-10-CM | POA: Diagnosis not present

## 2017-03-26 DIAGNOSIS — Z79899 Other long term (current) drug therapy: Secondary | ICD-10-CM | POA: Diagnosis not present

## 2017-03-26 DIAGNOSIS — E782 Mixed hyperlipidemia: Secondary | ICD-10-CM | POA: Diagnosis not present

## 2017-03-26 DIAGNOSIS — E039 Hypothyroidism, unspecified: Secondary | ICD-10-CM | POA: Diagnosis not present

## 2017-06-08 DIAGNOSIS — L239 Allergic contact dermatitis, unspecified cause: Secondary | ICD-10-CM | POA: Diagnosis not present

## 2017-06-26 DIAGNOSIS — Z79899 Other long term (current) drug therapy: Secondary | ICD-10-CM | POA: Diagnosis not present

## 2017-06-26 DIAGNOSIS — E039 Hypothyroidism, unspecified: Secondary | ICD-10-CM | POA: Diagnosis not present

## 2017-07-02 DIAGNOSIS — M2021 Hallux rigidus, right foot: Secondary | ICD-10-CM | POA: Diagnosis not present

## 2017-07-02 DIAGNOSIS — M79671 Pain in right foot: Secondary | ICD-10-CM | POA: Diagnosis not present

## 2017-07-02 DIAGNOSIS — M2011 Hallux valgus (acquired), right foot: Secondary | ICD-10-CM | POA: Diagnosis not present

## 2017-07-10 NOTE — Telephone Encounter (Signed)
error 

## 2017-07-19 DIAGNOSIS — Z23 Encounter for immunization: Secondary | ICD-10-CM | POA: Diagnosis not present

## 2017-09-10 DIAGNOSIS — Z79899 Other long term (current) drug therapy: Secondary | ICD-10-CM | POA: Diagnosis not present

## 2017-09-10 DIAGNOSIS — E039 Hypothyroidism, unspecified: Secondary | ICD-10-CM | POA: Diagnosis not present

## 2017-09-10 DIAGNOSIS — R002 Palpitations: Secondary | ICD-10-CM | POA: Diagnosis not present

## 2017-09-10 DIAGNOSIS — Z1231 Encounter for screening mammogram for malignant neoplasm of breast: Secondary | ICD-10-CM | POA: Diagnosis not present

## 2017-09-10 DIAGNOSIS — E559 Vitamin D deficiency, unspecified: Secondary | ICD-10-CM | POA: Diagnosis not present

## 2017-09-10 DIAGNOSIS — E782 Mixed hyperlipidemia: Secondary | ICD-10-CM | POA: Diagnosis not present

## 2017-09-10 DIAGNOSIS — E538 Deficiency of other specified B group vitamins: Secondary | ICD-10-CM | POA: Diagnosis not present

## 2017-09-17 DIAGNOSIS — E782 Mixed hyperlipidemia: Secondary | ICD-10-CM | POA: Diagnosis not present

## 2017-09-17 DIAGNOSIS — R002 Palpitations: Secondary | ICD-10-CM | POA: Diagnosis not present

## 2017-09-19 DIAGNOSIS — E538 Deficiency of other specified B group vitamins: Secondary | ICD-10-CM | POA: Diagnosis not present

## 2017-09-19 DIAGNOSIS — Z79899 Other long term (current) drug therapy: Secondary | ICD-10-CM | POA: Diagnosis not present

## 2017-09-19 DIAGNOSIS — E782 Mixed hyperlipidemia: Secondary | ICD-10-CM | POA: Diagnosis not present

## 2017-09-19 DIAGNOSIS — E559 Vitamin D deficiency, unspecified: Secondary | ICD-10-CM | POA: Diagnosis not present

## 2017-09-19 DIAGNOSIS — E039 Hypothyroidism, unspecified: Secondary | ICD-10-CM | POA: Diagnosis not present

## 2017-09-20 ENCOUNTER — Other Ambulatory Visit: Payer: Self-pay | Admitting: Internal Medicine

## 2017-09-24 DIAGNOSIS — R002 Palpitations: Secondary | ICD-10-CM | POA: Diagnosis not present

## 2017-09-25 DIAGNOSIS — R002 Palpitations: Secondary | ICD-10-CM | POA: Diagnosis not present

## 2017-10-01 DIAGNOSIS — I493 Ventricular premature depolarization: Secondary | ICD-10-CM | POA: Diagnosis not present

## 2017-10-01 DIAGNOSIS — E782 Mixed hyperlipidemia: Secondary | ICD-10-CM | POA: Diagnosis not present

## 2017-10-01 DIAGNOSIS — R002 Palpitations: Secondary | ICD-10-CM | POA: Diagnosis not present

## 2017-11-11 ENCOUNTER — Other Ambulatory Visit: Payer: Self-pay | Admitting: Internal Medicine

## 2017-11-11 DIAGNOSIS — Z1231 Encounter for screening mammogram for malignant neoplasm of breast: Secondary | ICD-10-CM

## 2017-12-09 ENCOUNTER — Ambulatory Visit
Admission: RE | Admit: 2017-12-09 | Discharge: 2017-12-09 | Disposition: A | Discharge status: Home / Self Care | Payer: Medicare Other | Source: Ambulatory Visit | Attending: Internal Medicine | Admitting: Internal Medicine

## 2017-12-09 DIAGNOSIS — Z1231 Encounter for screening mammogram for malignant neoplasm of breast: Secondary | ICD-10-CM | POA: Diagnosis present

## 2018-09-19 ENCOUNTER — Telehealth: Payer: Self-pay

## 2018-09-19 ENCOUNTER — Other Ambulatory Visit: Payer: Self-pay

## 2018-09-19 DIAGNOSIS — Z800 Family history of malignant neoplasm of digestive organs: Secondary | ICD-10-CM

## 2018-09-19 NOTE — Telephone Encounter (Signed)
Gastroenterology Pre-Procedure Review  Request Date:  Requesting Physician: Dr.   PATIENT REVIEW QUESTIONS: The patient responded to the following health history questions as indicated:    1. Are you having any GI issues? no 2. Do you have a personal history of Polyps? yes (5 years) 3. Do you have a family history of Colon Cancer or Polyps? yes (Mother and sister colon cancer) 4. Diabetes Mellitus? no 5. Joint replacements in the past 12 months?no 6. Major health problems in the past 3 months?no 7. Any artificial heart valves, MVP, or defibrillator?no    MEDICATIONS & ALLERGIES:    Patient reports the following regarding taking any anticoagulation/antiplatelet therapy:   Plavix, Coumadin, Eliquis, Xarelto, Lovenox, Pradaxa, Brilinta, or Effient? no Aspirin? yes (ASA 81mg )  Patient confirms/reports the following medications:  Current Outpatient Medications  Medication Sig Dispense Refill  . amoxicillin (AMOXIL) 875 MG tablet Take 875 mg by mouth 2 (two) times daily.    Marland Kitchen aspirin 81 MG tablet Take 81 mg by mouth daily.      . cetirizine (ZYRTEC) 10 MG tablet Take 10 mg by mouth daily.      . Cholecalciferol (VITAMIN D3) 2000 UNITS TABS Take 1 tablet by mouth daily.    . cyanocobalamin (,VITAMIN B-12,) 1000 MCG/ML injection INJECT 1 ML (1,000 MCG TOTAL) INTO THE MUSCLE EVERY 30 DAYS. 10 mL 1  . levothyroxine (SYNTHROID, LEVOTHROID) 50 MCG tablet Take 1 tablet (50 mcg total) by mouth daily before breakfast. TAKE ONE TABLET BY MOUTH EVERY MORNING 90 tablet 1  . magnesium oxide (MAG-OX) 400 MG tablet Take 400 mg by mouth daily.      . simvastatin (ZOCOR) 20 MG tablet Take 1 tablet (20 mg total) by mouth at bedtime. 90 tablet 3   No current facility-administered medications for this visit.     Patient confirms/reports the following allergies:  No Known Allergies  No orders of the defined types were placed in this encounter.   AUTHORIZATION INFORMATION Primary  Insurance: 1D#: Group #:  Secondary Insurance: 1D#: Group #:  SCHEDULE INFORMATION: Date: 10/14/18 Time: Location:

## 2018-10-14 ENCOUNTER — Ambulatory Visit: Payer: Medicare Other | Admitting: Certified Registered"

## 2018-10-14 ENCOUNTER — Encounter
Admission: RE | Disposition: A | Discharge status: Home / Self Care | Payer: Self-pay | Source: Home / Self Care | Attending: Gastroenterology

## 2018-10-14 ENCOUNTER — Ambulatory Visit
Admission: RE | Admit: 2018-10-14 | Discharge: 2018-10-14 | Disposition: A | Discharge status: Home / Self Care | Payer: Medicare Other | Attending: Gastroenterology | Admitting: Gastroenterology

## 2018-10-14 DIAGNOSIS — Z90710 Acquired absence of both cervix and uterus: Secondary | ICD-10-CM | POA: Insufficient documentation

## 2018-10-14 DIAGNOSIS — K5730 Diverticulosis of large intestine without perforation or abscess without bleeding: Secondary | ICD-10-CM | POA: Insufficient documentation

## 2018-10-14 DIAGNOSIS — E785 Hyperlipidemia, unspecified: Secondary | ICD-10-CM | POA: Insufficient documentation

## 2018-10-14 DIAGNOSIS — D125 Benign neoplasm of sigmoid colon: Secondary | ICD-10-CM | POA: Insufficient documentation

## 2018-10-14 DIAGNOSIS — K641 Second degree hemorrhoids: Secondary | ICD-10-CM | POA: Diagnosis not present

## 2018-10-14 DIAGNOSIS — Z800 Family history of malignant neoplasm of digestive organs: Secondary | ICD-10-CM

## 2018-10-14 DIAGNOSIS — E039 Hypothyroidism, unspecified: Secondary | ICD-10-CM | POA: Diagnosis not present

## 2018-10-14 DIAGNOSIS — Z8249 Family history of ischemic heart disease and other diseases of the circulatory system: Secondary | ICD-10-CM | POA: Diagnosis not present

## 2018-10-14 DIAGNOSIS — Z823 Family history of stroke: Secondary | ICD-10-CM | POA: Diagnosis not present

## 2018-10-14 DIAGNOSIS — Z860100 Personal history of colon polyps, unspecified: Secondary | ICD-10-CM

## 2018-10-14 DIAGNOSIS — Z79899 Other long term (current) drug therapy: Secondary | ICD-10-CM | POA: Insufficient documentation

## 2018-10-14 DIAGNOSIS — Z1211 Encounter for screening for malignant neoplasm of colon: Secondary | ICD-10-CM | POA: Insufficient documentation

## 2018-10-14 DIAGNOSIS — K635 Polyp of colon: Secondary | ICD-10-CM

## 2018-10-14 DIAGNOSIS — Z7982 Long term (current) use of aspirin: Secondary | ICD-10-CM | POA: Diagnosis not present

## 2018-10-14 DIAGNOSIS — Z825 Family history of asthma and other chronic lower respiratory diseases: Secondary | ICD-10-CM | POA: Diagnosis not present

## 2018-10-14 HISTORY — PX: COLONOSCOPY WITH PROPOFOL: SHX5780

## 2018-10-14 HISTORY — DX: Hypothyroidism, unspecified: E03.9

## 2018-10-14 SURGERY — COLONOSCOPY WITH PROPOFOL
Anesthesia: General

## 2018-10-14 MED ORDER — PROPOFOL 500 MG/50ML IV EMUL
INTRAVENOUS | Status: DC | PRN
  Administered 2018-10-14: 150 ug/kg/min via INTRAVENOUS

## 2018-10-14 MED ORDER — SODIUM CHLORIDE 0.9 % IV SOLN
INTRAVENOUS | Status: DC
  Administered 2018-10-14: 09:00:00 via INTRAVENOUS

## 2018-10-14 MED ORDER — PROPOFOL 10 MG/ML IV BOLUS
INTRAVENOUS | Status: AC
Start: 2018-10-14 — End: ?
  Filled 2018-10-14: qty 40

## 2018-10-14 MED ORDER — PROPOFOL 10 MG/ML IV BOLUS
INTRAVENOUS | Status: DC | PRN
  Administered 2018-10-14: 40 mg via INTRAVENOUS

## 2018-10-14 NOTE — H&P (Signed)
[name], MD [facility] [address] [place], [place] [address] Phone:[phone] Fax : [phone]  Primary Care Physician:  Idelle Crouch, MD Primary Gastroenterologist:  Dr. Allen Norris  Pre-Procedure History & Physical: HPI:  Catherine Smith is a 77 y.o. female is here for an colonoscopy.   Past Medical History:  Diagnosis Date  . Bimalleolar ankle fracture Feb 17 2011   surgery Dr. Kathyrn Sheriff and Sharlett Iles, [place]  . Hyperlipidemia   . Hypothyroidism   . Pap smear for cervical cancer screening 2010   normal  . Screening for breast cancer 2011   normal  . Thyroid disease     Past Surgical History:  Procedure Laterality Date  . BACK SURGERY  07/15/12   Herniated Disc L5, Sliver of bone protruding into sciatic nerve  . bilateral ankle fusion    . BREAST CYST EXCISION Right 1970   benign  . BUNIONECTOMY    . COLONOSCOPY    . ESOPHAGOGASTRODUODENOSCOPY    . SPINE SURGERY  1994   cervical disk 3through 6, [place]   left  hand  . VAGINAL HYSTERECTOMY  1976    Prior to Admission medications   Medication Sig Start Date End Date Taking? Authorizing Provider  levothyroxine (SYNTHROID, LEVOTHROID) 50 MCG tablet Take 1 tablet (50 mcg total) by mouth daily before breakfast. TAKE ONE TABLET BY MOUTH EVERY MORNING 02/23/16  Yes Crecencio Mc, MD  amoxicillin (AMOXIL) 875 MG tablet Take 875 mg by mouth 2 (two) times daily. 09/20/14   [provider]  aspirin 81 MG tablet Take 81 mg by mouth daily.      [provider]  cetirizine (ZYRTEC) 10 MG tablet Take 10 mg by mouth daily.      [provider]  Cholecalciferol (VITAMIN D3) 2000 UNITS TABS Take 1 tablet by mouth daily.    [provider]  cyanocobalamin (,VITAMIN B-12,) 1000 MCG/ML injection INJECT 1 ML (1,000 MCG TOTAL) INTO THE MUSCLE EVERY 30 DAYS. 09/20/17   Crecencio Mc, MD  magnesium oxide (MAG-OX) 400 MG tablet Take 400 mg by mouth  daily.      [provider]  simvastatin (ZOCOR) 20 MG tablet Take 1 tablet (20 mg total) by mouth at bedtime. 10/04/15   Crecencio Mc, MD    Allergies as of 09/19/2018  . (No Known Allergies)    Family History  Problem Relation Age of Onset  . Cancer Mother 45       colon Ca   . Heart disease Mother   . Stroke Mother 73       died after CVA  . Heart disease Father 56       AMI, died at 30 of heart failure  . COPD Sister   . COPD Brother   . Heart disease Brother     Social History   Socioeconomic History  . Marital status: Married    Spouse name: Not on file  . Number of children: Not on file  . Years of education: Not on file  . Highest education level: Not on file  Occupational History  . Not on file  Social Needs  . Financial resource strain: Not on file  . Food insecurity:    Worry: Not on file    Inability: Not on file  . Transportation needs:    Medical: Not on file    Non-medical: Not on file  Tobacco Use  .  Smoking status: Never Smoker  . Smokeless tobacco: Never Used  Substance and Sexual Activity  . Alcohol use: No  . Drug use: No  . Sexual activity: Not on file  Lifestyle  . Physical activity:    Days per week: Not on file    Minutes per session: Not on file  . Stress: Not on file  Relationships  . Social connections:    Talks on phone: Not on file    Gets together: Not on file    Attends religious service: Not on file    Active member of club or organization: Not on file    Attends meetings of clubs or organizations: Not on file    Relationship status: Not on file  . Intimate partner violence:    Fear of current or ex partner: Not on file    Emotionally abused: Not on file    Physically abused: Not on file    Forced sexual activity: Not on file  Other Topics Concern  . Not on file  Social History Narrative  . Not on file    Review of Systems: See HPI, otherwise negative ROS  Physical Exam: BP (!) 147/69   Pulse 81    Temp (!) 96.1 F (35.6 C) (Tympanic)   Resp 20   Ht 5' 4.5" (1.638 m)   Wt 64.9 kg   SpO2 98%   BMI 24.17 kg/m  General:   Alert,  pleasant and cooperative in NAD Head:  Normocephalic and atraumatic. Neck:  Supple; no masses or thyromegaly. Lungs:  Clear throughout to auscultation.    Heart:  Regular rate and rhythm. Abdomen:  Soft, nontender and nondistended. Normal bowel sounds, without guarding, and without rebound.   Neurologic:  Alert and  oriented x4;  grossly normal neurologically.  Impression/Plan: Catherine Smith is here for an colonoscopy to be performed for history of colon polyps  Risks, benefits, limitations, and alternatives regarding  colonoscopy have been reviewed with the patient.  Questions have been answered.  All parties agreeable.   [name], MD  10/14/2018, 8:55 AM

## 2018-10-14 NOTE — Anesthesia Preprocedure Evaluation (Signed)
Anesthesia Evaluation  Patient identified by MRN, date of birth, ID band Patient awake    Reviewed: Allergy & Precautions, NPO status , Patient's Chart, lab work & pertinent test results  History of Anesthesia Complications Negative for: history of anesthetic complications  Airway Mallampati: II  TM Distance: >3 FB Neck ROM: Full    Dental no notable dental hx.    Pulmonary neg pulmonary ROS, neg sleep apnea, neg COPD,    breath sounds clear to auscultation- rhonchi (-) wheezing      Cardiovascular Exercise Tolerance: Good (-) hypertension(-) CAD, (-) Past MI, (-) Cardiac Stents and (-) CABG  Rhythm:Regular Rate:Normal - Systolic murmurs and - Diastolic murmurs    Neuro/Psych neg Seizures negative neurological ROS  negative psych ROS   GI/Hepatic negative GI ROS, Neg liver ROS,   Endo/Other  neg diabetesHypothyroidism   Renal/GU negative Renal ROS     Musculoskeletal negative musculoskeletal ROS (+)   Abdominal (+) - obese,   Peds  Hematology negative hematology ROS (+)   Anesthesia Other Findings Past Medical History: Feb 17 2011: Bimalleolar ankle fracture     Comment:  surgery Dr. Kathyrn Sheriff and Sharlett Iles, [facility] No date: Hyperlipidemia No date: Hypothyroidism 2010: Pap smear for cervical cancer screening     Comment:  normal 2011: Screening for breast cancer     Comment:  normal No date: Thyroid disease   Reproductive/Obstetrics                             Anesthesia Physical Anesthesia Plan  ASA: II  Anesthesia Plan: General   Post-op Pain Management:    Induction: Intravenous  PONV Risk Score and Plan: 2 and Propofol infusion  Airway Management Planned: Natural Airway  Additional Equipment:   Intra-op Plan:   Post-operative Plan:   Informed Consent: I have reviewed the patients History and Physical, chart, labs and discussed the procedure including the  risks, benefits and alternatives for the proposed anesthesia with the patient or authorized representative who has indicated his/her understanding and acceptance.   Dental advisory given  Plan Discussed with: CRNA and Anesthesiologist  Anesthesia Plan Comments:         Anesthesia Quick Evaluation

## 2018-10-14 NOTE — Transfer of Care (Signed)
Immediate Anesthesia Transfer of Care Note  Patient: Catherine Smith  Procedure(s) Performed: COLONOSCOPY WITH PROPOFOL (N/A )  Patient Location: Endoscopy Unit  Anesthesia Type:General  Level of Consciousness: awake, alert  and oriented  Airway & Oxygen Therapy: Patient Spontanous Breathing and Patient connected to nasal cannula oxygen  Post-op Assessment: Report given to RN and Post -op Vital signs reviewed and stable  Post vital signs: Reviewed and stable  Last Vitals:  Vitals Value Taken Time  BP    Temp    Pulse    Resp    SpO2      Last Pain:  Vitals:   10/14/18 0811  TempSrc: Tympanic  PainSc: 0-No pain         Complications: No apparent anesthesia complications

## 2018-10-14 NOTE — Op Note (Signed)
[facility]enter[facility] Patient Name: Catherine Smith Procedure Date: 10/14/2018 8:56 AM MRN: [id number] Account #: [id number] Date of Birth: 07/29/1941 Admit Type: Outpatient Age: 77 Room: [facility] ENDO ROOM 4 Gender: Female Note Status: Finalized Procedure:            Colonoscopy Indications:          High risk colon cancer surveillance: Personal history                        of colonic polyps Providers:            [name] MD, MD Referring MD:         Leonie Douglas. Doy Hutching, MD (Referring MD) Medicines:            Propofol per Anesthesia Complications:        No immediate complications. Procedure:            Pre-Anesthesia Assessment:                       - Prior to the procedure, a History and Physical was                        performed, and patient medications and allergies were                        reviewed. The patient's tolerance of previous                        anesthesia was also reviewed. The risks and benefits of                        the procedure and the sedation options and risks were                        discussed with the patient. All questions were                        answered, and informed consent was obtained. Prior                        Anticoagulants: The patient has taken no previous                        anticoagulant or antiplatelet agents. ASA Grade                        Assessment: II - A patient with mild systemic disease.                        After reviewing the risks and benefits, the patient was                        deemed in satisfactory condition to undergo the                        procedure.                       After obtaining informed consent, the colonoscope was  passed under direct vision. Throughout the procedure,                        the patient's blood pressure, pulse, and oxygen                        saturations were monitored continuously. The                        Colonoscope  was introduced through the anus and                        advanced to the the cecum, identified by appendiceal                        orifice and ileocecal valve. The colonoscopy was                        performed without difficulty. The patient tolerated the                        procedure well. The quality of the bowel preparation                        was excellent. Findings:      The perianal and digital rectal examinations were normal.      A 6 mm polyp was found in the sigmoid colon. The polyp was sessile. The       polyp was removed with a cold snare. Resection and retrieval were       complete.      A few small-mouthed diverticula were found in the entire colon.      Non-bleeding internal hemorrhoids were found during retroflexion. The       hemorrhoids were Grade II (internal hemorrhoids that prolapse but reduce       spontaneously). Impression:           - One 6 mm polyp in the sigmoid colon, removed with a                        cold snare. Resected and retrieved.                       - Diverticulosis in the entire examined colon.                       - Non-bleeding internal hemorrhoids. Recommendation:       - Discharge patient to home.                       - Resume previous diet.                       - Continue present medications.                       - Await pathology results.                       - Repeat colonoscopy in 5 years for surveillance. Procedure Code(s):    --- Professional ---  45385, Colonoscopy, flexible; with removal of tumor(s),                        polyp(s), or other lesion(s) by snare technique Diagnosis Code(s):    --- Professional ---                       Z86.010, Personal history of colonic polyps                       D12.5, Benign neoplasm of sigmoid colon CPT copyright 2018 American Medical Association. All rights reserved. The codes documented in this report are preliminary and upon coder review may  be revised  to meet current compliance requirements. [name] MD, MD 10/14/2018 9:29:45 AM This report has been signed electronically. Number of Addenda: 0 Note Initiated On: 10/14/2018 8:56 AM Scope Withdrawal Time: 0 hours 8 minutes 11 seconds  Total Procedure Duration: 0 hours 17 minutes 25 seconds       [facility]

## 2018-10-14 NOTE — Anesthesia Postprocedure Evaluation (Signed)
Anesthesia Post Note  Patient: Catherine Smith  Procedure(s) Performed: COLONOSCOPY WITH PROPOFOL (N/A )  Patient location during evaluation: Endoscopy Anesthesia Type: General Level of consciousness: awake and alert and oriented Pain management: pain level controlled Vital Signs Assessment: post-procedure vital signs reviewed and stable Respiratory status: spontaneous breathing, nonlabored ventilation and respiratory function stable Cardiovascular status: blood pressure returned to baseline and stable Postop Assessment: no signs of nausea or vomiting Anesthetic complications: no     Last Vitals:  Vitals:   10/14/18 0953 10/14/18 1003  BP: 107/63 (!) 114/57  Pulse:    Resp:    Temp:    SpO2:      Last Pain:  Vitals:   10/14/18 1003  TempSrc:   PainSc: 0-No pain                 Hasset Chaviano

## 2018-10-14 NOTE — Anesthesia Post-op Follow-up Note (Signed)
Anesthesia QCDR form completed.        

## 2018-10-16 ENCOUNTER — Encounter: Payer: Self-pay | Admitting: Gastroenterology

## 2018-10-16 LAB — SURGICAL PATHOLOGY

## 2018-11-25 ENCOUNTER — Other Ambulatory Visit: Payer: Self-pay | Admitting: Internal Medicine

## 2018-11-25 DIAGNOSIS — Z1231 Encounter for screening mammogram for malignant neoplasm of breast: Secondary | ICD-10-CM

## 2018-12-10 ENCOUNTER — Ambulatory Visit
Admission: RE | Admit: 2018-12-10 | Discharge: 2018-12-10 | Disposition: A | Discharge status: Home / Self Care | Payer: Medicare Other | Source: Ambulatory Visit | Attending: Internal Medicine | Admitting: Internal Medicine

## 2018-12-10 DIAGNOSIS — Z1231 Encounter for screening mammogram for malignant neoplasm of breast: Secondary | ICD-10-CM | POA: Insufficient documentation

## 2019-09-03 ENCOUNTER — Other Ambulatory Visit: Payer: Self-pay | Admitting: Internal Medicine

## 2019-09-03 DIAGNOSIS — I10 Essential (primary) hypertension: Secondary | ICD-10-CM

## 2019-09-03 DIAGNOSIS — N289 Disorder of kidney and ureter, unspecified: Secondary | ICD-10-CM

## 2019-09-04 ENCOUNTER — Other Ambulatory Visit: Payer: Self-pay | Admitting: Internal Medicine

## 2019-09-04 DIAGNOSIS — N189 Chronic kidney disease, unspecified: Secondary | ICD-10-CM

## 2019-09-04 DIAGNOSIS — I10 Essential (primary) hypertension: Secondary | ICD-10-CM

## 2019-09-04 DIAGNOSIS — N289 Disorder of kidney and ureter, unspecified: Secondary | ICD-10-CM

## 2019-09-16 ENCOUNTER — Ambulatory Visit
Admission: RE | Admit: 2019-09-16 | Discharge: 2019-09-16 | Disposition: A | Discharge status: Home / Self Care | Payer: Medicare Other | Source: Ambulatory Visit | Attending: Internal Medicine | Admitting: Internal Medicine

## 2019-09-16 ENCOUNTER — Other Ambulatory Visit: Payer: Self-pay

## 2019-09-16 DIAGNOSIS — N289 Disorder of kidney and ureter, unspecified: Secondary | ICD-10-CM | POA: Insufficient documentation

## 2019-09-16 DIAGNOSIS — N189 Chronic kidney disease, unspecified: Secondary | ICD-10-CM | POA: Diagnosis present

## 2019-09-16 DIAGNOSIS — I10 Essential (primary) hypertension: Secondary | ICD-10-CM | POA: Insufficient documentation

## 2019-10-30 ENCOUNTER — Other Ambulatory Visit: Payer: Self-pay | Admitting: Internal Medicine

## 2019-10-30 DIAGNOSIS — Z1231 Encounter for screening mammogram for malignant neoplasm of breast: Secondary | ICD-10-CM

## 2019-12-14 ENCOUNTER — Ambulatory Visit
Admission: RE | Admit: 2019-12-14 | Discharge: 2019-12-14 | Disposition: A | Discharge status: Home / Self Care | Payer: Medicare Other | Source: Ambulatory Visit | Attending: Internal Medicine | Admitting: Internal Medicine

## 2019-12-14 DIAGNOSIS — Z1231 Encounter for screening mammogram for malignant neoplasm of breast: Secondary | ICD-10-CM

## 2020-04-20 ENCOUNTER — Encounter: Payer: Self-pay | Admitting: Ophthalmology

## 2020-04-20 ENCOUNTER — Other Ambulatory Visit: Payer: Self-pay

## 2020-04-25 NOTE — Discharge Instructions (Signed)

## 2020-04-27 ENCOUNTER — Encounter
Admission: RE | Disposition: A | Discharge status: Home / Self Care | Payer: Self-pay | Source: Home / Self Care | Attending: Ophthalmology

## 2020-04-27 ENCOUNTER — Other Ambulatory Visit: Payer: Self-pay

## 2020-04-27 ENCOUNTER — Ambulatory Visit: Payer: Medicare Other | Admitting: Anesthesiology

## 2020-04-27 ENCOUNTER — Ambulatory Visit
Admission: RE | Admit: 2020-04-27 | Discharge: 2020-04-27 | Disposition: A | Discharge status: Home / Self Care | Payer: Medicare Other | Attending: Ophthalmology | Admitting: Ophthalmology

## 2020-04-27 DIAGNOSIS — Z91048 Other nonmedicinal substance allergy status: Secondary | ICD-10-CM | POA: Insufficient documentation

## 2020-04-27 DIAGNOSIS — E039 Hypothyroidism, unspecified: Secondary | ICD-10-CM | POA: Diagnosis not present

## 2020-04-27 DIAGNOSIS — R000 Tachycardia, unspecified: Secondary | ICD-10-CM | POA: Insufficient documentation

## 2020-04-27 DIAGNOSIS — H2512 Age-related nuclear cataract, left eye: Secondary | ICD-10-CM | POA: Insufficient documentation

## 2020-04-27 DIAGNOSIS — E7800 Pure hypercholesterolemia, unspecified: Secondary | ICD-10-CM | POA: Diagnosis not present

## 2020-04-27 HISTORY — PX: CATARACT EXTRACTION W/PHACO: SHX586

## 2020-04-27 SURGERY — PHACOEMULSIFICATION, CATARACT, WITH IOL INSERTION
Anesthesia: Monitor Anesthesia Care | Site: Eye | Laterality: Left

## 2020-04-27 MED ORDER — MIDAZOLAM HCL 2 MG/2ML IJ SOLN
INTRAMUSCULAR | Status: DC | PRN
  Administered 2020-04-27: 1 mg via INTRAVENOUS

## 2020-04-27 MED ORDER — BRIMONIDINE TARTRATE-TIMOLOL 0.2-0.5 % OP SOLN
OPHTHALMIC | Status: DC | PRN
  Administered 2020-04-27: 1 [drp] via OPHTHALMIC

## 2020-04-27 MED ORDER — ARMC OPHTHALMIC DILATING DROPS
1.0000 "application " | OPHTHALMIC | Status: DC | PRN
  Administered 2020-04-27 (×3): 1 via OPHTHALMIC

## 2020-04-27 MED ORDER — CEFUROXIME OPHTHALMIC INJECTION 1 MG/0.1 ML
INJECTION | OPHTHALMIC | Status: DC | PRN
  Administered 2020-04-27: 0.1 mL via INTRACAMERAL

## 2020-04-27 MED ORDER — ACETAMINOPHEN 160 MG/5ML PO SOLN: 325.0000 mg | ORAL | Status: DC | PRN

## 2020-04-27 MED ORDER — MOXIFLOXACIN HCL 0.5 % OP SOLN
1.0000 [drp] | OPHTHALMIC | Status: DC | PRN
  Administered 2020-04-27 (×3): 1 [drp] via OPHTHALMIC

## 2020-04-27 MED ORDER — TETRACAINE HCL 0.5 % OP SOLN
1.0000 [drp] | OPHTHALMIC | Status: DC | PRN
  Administered 2020-04-27 (×3): 1 [drp] via OPHTHALMIC

## 2020-04-27 MED ORDER — EPINEPHRINE PF 1 MG/ML IJ SOLN
INTRAOCULAR | Status: DC | PRN
  Administered 2020-04-27: 68 mL via OPHTHALMIC

## 2020-04-27 MED ORDER — LIDOCAINE HCL (PF) 2 % IJ SOLN
INTRAOCULAR | Status: DC | PRN
  Administered 2020-04-27: 1 mL

## 2020-04-27 MED ORDER — ACETAMINOPHEN 325 MG PO TABS: 325.0000 mg | ORAL_TABLET | ORAL | Status: DC | PRN

## 2020-04-27 MED ORDER — LACTATED RINGERS IV SOLN: INTRAVENOUS | Status: DC

## 2020-04-27 MED ORDER — FENTANYL CITRATE (PF) 100 MCG/2ML IJ SOLN
INTRAMUSCULAR | Status: DC | PRN
  Administered 2020-04-27: 50 ug via INTRAVENOUS

## 2020-04-27 MED ORDER — NA HYALUR & NA CHOND-NA HYALUR 0.4-0.35 ML IO KIT
PACK | INTRAOCULAR | Status: DC | PRN
  Administered 2020-04-27: 1 mL via INTRAOCULAR

## 2020-04-27 SURGICAL SUPPLY — 29 items
CANNULA ANT/CHMB 27G (MISCELLANEOUS) ×1 IMPLANT
CANNULA ANT/CHMB 27GA (MISCELLANEOUS) ×2 IMPLANT
GLOVE SURG LX 7.5 STRW (GLOVE) ×2
GLOVE SURG LX STRL 7.5 STRW (GLOVE) ×1 IMPLANT
GLOVE SURG TRIUMPH 8.0 PF LTX (GLOVE) ×2 IMPLANT
GOWN STRL REUS W/ TWL LRG LVL3 (GOWN DISPOSABLE) ×2 IMPLANT
GOWN STRL REUS W/TWL LRG LVL3 (GOWN DISPOSABLE) ×2
LENS IOL DIOP 19.5 (Intraocular Lens) ×2 IMPLANT
LENS IOL TECNIS MONO 19.5 (Intraocular Lens) IMPLANT
MARKER SKIN DUAL TIP RULER LAB (MISCELLANEOUS) ×2 IMPLANT
NDL CAPSULORHEX 25GA (NEEDLE) ×1 IMPLANT
NDL FILTER BLUNT 18X1 1/2 (NEEDLE) ×2 IMPLANT
NDL RETROBULBAR .5 NSTRL (NEEDLE) IMPLANT
NEEDLE CAPSULORHEX 25GA (NEEDLE) ×2 IMPLANT
NEEDLE FILTER BLUNT 18X 1/2SAF (NEEDLE) ×2
NEEDLE FILTER BLUNT 18X1 1/2 (NEEDLE) ×2 IMPLANT
PACK CATARACT BRASINGTON (MISCELLANEOUS) ×2 IMPLANT
PACK EYE AFTER SURG (MISCELLANEOUS) ×2 IMPLANT
PACK OPTHALMIC (MISCELLANEOUS) ×2 IMPLANT
RING MALYGIN 7.0 (MISCELLANEOUS) IMPLANT
SOLUTION OPHTHALMIC SALT (MISCELLANEOUS) ×2 IMPLANT
SUT ETHILON 10-0 CS-B-6CS-B-6 (SUTURE)
SUT VICRYL  9 0 (SUTURE)
SUT VICRYL 9 0 (SUTURE) IMPLANT
SUTURE EHLN 10-0 CS-B-6CS-B-6 (SUTURE) IMPLANT
SYR 3ML LL SCALE MARK (SYRINGE) ×4 IMPLANT
SYR TB 1ML LUER SLIP (SYRINGE) ×2 IMPLANT
WATER STERILE IRR 250ML POUR (IV SOLUTION) ×2 IMPLANT
WIPE NON LINTING 3.25X3.25 (MISCELLANEOUS) ×2 IMPLANT

## 2020-04-27 NOTE — Transfer of Care (Signed)
Immediate Anesthesia Transfer of Care Note  Patient: Catherine Smith  Procedure(s) Performed: CATARACT EXTRACTION PHACO AND INTRAOCULAR LENS PLACEMENT (IOC) LEFT 5.65  01:03.1  9.0% (Left Eye)  Patient Location: PACU  Anesthesia Type: MAC  Level of Consciousness: awake, alert  and patient cooperative  Airway and Oxygen Therapy: Patient Spontanous Breathing and Patient connected to supplemental oxygen  Post-op Assessment: Post-op Vital signs reviewed, Patient's Cardiovascular Status Stable, Respiratory Function Stable, Patent Airway and No signs of Nausea or vomiting  Post-op Vital Signs: Reviewed and stable  Complications: No complications documented.

## 2020-04-27 NOTE — Anesthesia Preprocedure Evaluation (Addendum)
Anesthesia Evaluation  Patient identified by MRN, date of birth, ID band Patient awake    Reviewed: Allergy & Precautions, H&P , NPO status , Patient's Chart, lab work & pertinent test results, reviewed documented beta blocker date and time   Airway Mallampati: II  TM Distance: >3 FB Neck ROM: full    Dental no notable dental hx.    Pulmonary neg pulmonary ROS,    Pulmonary exam normal breath sounds clear to auscultation       Cardiovascular Exercise Tolerance: Good negative cardio ROS Normal cardiovascular exam Rhythm:regular Rate:Normal     Neuro/Psych negative neurological ROS  negative psych ROS   GI/Hepatic negative GI ROS, Neg liver ROS,   Endo/Other  Hypothyroidism   Renal/GU negative Renal ROS  negative genitourinary   Musculoskeletal   Abdominal   Peds  Hematology negative hematology ROS (+)   Anesthesia Other Findings   Reproductive/Obstetrics negative OB ROS                             Anesthesia Physical Anesthesia Plan  ASA: II  Anesthesia Plan: MAC   Post-op Pain Management:    Induction:   PONV Risk Score and Plan:   Airway Management Planned:   Additional Equipment:   Intra-op Plan:   Post-operative Plan:   Informed Consent: I have reviewed the patients History and Physical, chart, labs and discussed the procedure including the risks, benefits and alternatives for the proposed anesthesia with the patient or authorized representative who has indicated his/her understanding and acceptance.     Dental Advisory Given  Plan Discussed with: CRNA  Anesthesia Plan Comments:        Anesthesia Quick Evaluation

## 2020-04-27 NOTE — Anesthesia Postprocedure Evaluation (Signed)
Anesthesia Post Note  Patient: Dayane Hillenburg  Procedure(s) Performed: CATARACT EXTRACTION PHACO AND INTRAOCULAR LENS PLACEMENT (IOC) LEFT 5.65  01:03.1  9.0% (Left Eye)     Patient location during evaluation: PACU Anesthesia Type: MAC Level of consciousness: awake and alert Pain management: pain level controlled Vital Signs Assessment: post-procedure vital signs reviewed and stable Respiratory status: spontaneous breathing, nonlabored ventilation, respiratory function stable and patient connected to nasal cannula oxygen Cardiovascular status: stable and blood pressure returned to baseline Postop Assessment: no apparent nausea or vomiting Anesthetic complications: no   No complications documented.  Trecia Rogers

## 2020-04-27 NOTE — Op Note (Signed)
OPERATIVE NOTE  Catherine Smith [id number] 04/27/2020   PREOPERATIVE DIAGNOSIS:  Nuclear sclerotic cataract left eye. H25.12   POSTOPERATIVE DIAGNOSIS:    Nuclear sclerotic cataract left eye.     PROCEDURE:  Phacoemusification with posterior chamber intraocular lens placement of the left eye  Ultrasound time: Procedure(s): CATARACT EXTRACTION PHACO AND INTRAOCULAR LENS PLACEMENT (IOC) LEFT 5.65  01:03.1  9.0% (Left)  LENS:   Implant Name Type Inv. Item Serial No. Manufacturer Lot No. LRB No. Used Action  LENS IOL DIOP 19.5 - [id number] Intraocular Lens LENS IOL DIOP 19.5 [id number] [employer]  Left 1 Implanted      SURGEON:  Wyonia Hough, MD   ANESTHESIA:  Topical with tetracaine drops and 2% Xylocaine jelly, augmented with 1% preservative-free intracameral lidocaine.    COMPLICATIONS:  None.   DESCRIPTION OF PROCEDURE:  The patient was identified in the holding room and transported to the operating room and placed in the supine position under the operating microscope.  The left eye was identified as the operative eye and it was prepped and draped in the usual sterile ophthalmic fashion.   A 1 millimeter clear-corneal paracentesis was made at the 1:30 position.  0.5 ml of preservative-free 1% lidocaine was injected into the anterior chamber.  The anterior chamber was filled with Viscoat viscoelastic.  A 2.4 millimeter keratome was used to make a near-clear corneal incision at the 10:30 position.  .  A curvilinear capsulorrhexis was made with a cystotome and capsulorrhexis forceps.  Balanced salt solution was used to hydrodissect and hydrodelineate the nucleus.   Phacoemulsification was then used in stop and chop fashion to remove the lens nucleus and epinucleus.  The remaining cortex was then removed using the irrigation and aspiration handpiece. Provisc was then placed into the capsular bag to distend it for lens placement.  A lens was then injected into the  capsular bag.  The remaining viscoelastic was aspirated.   Wounds were hydrated with balanced salt solution.  The anterior chamber was inflated to a physiologic pressure with balanced salt solution.  No wound leaks were noted. Cefuroxime 0.1 ml of a 10mg /ml solution was injected into the anterior chamber for a dose of 1 mg of intracameral antibiotic at the completion of the case.   Timolol and Brimonidine drops were applied to the eye.  The patient was taken to the recovery room in stable condition without complications of anesthesia or surgery.  [name] 04/27/2020, 10:15 AM

## 2020-04-27 NOTE — Anesthesia Procedure Notes (Signed)
Procedure Name: MAC Date/Time: 04/27/2020 9:52 AM Performed by: Cameron Ali, CRNA Pre-anesthesia Checklist: Patient identified, Emergency Drugs available, Suction available, Timeout performed and Patient being monitored Patient Re-evaluated:Patient Re-evaluated prior to induction Oxygen Delivery Method: Nasal cannula Placement Confirmation: positive ETCO2

## 2020-04-27 NOTE — H&P (Signed)

## 2020-04-28 ENCOUNTER — Encounter: Payer: Self-pay | Admitting: Ophthalmology

## 2020-05-25 ENCOUNTER — Encounter: Payer: Self-pay | Admitting: Ophthalmology

## 2020-05-30 ENCOUNTER — Other Ambulatory Visit
Admission: RE | Admit: 2020-05-30 | Discharge: 2020-05-30 | Disposition: A | Discharge status: Home / Self Care | Payer: Medicare Other | Source: Ambulatory Visit | Attending: Ophthalmology | Admitting: Ophthalmology

## 2020-05-30 ENCOUNTER — Other Ambulatory Visit: Payer: Self-pay

## 2020-05-30 DIAGNOSIS — Z20822 Contact with and (suspected) exposure to covid-19: Secondary | ICD-10-CM | POA: Insufficient documentation

## 2020-05-30 DIAGNOSIS — Z01812 Encounter for preprocedural laboratory examination: Secondary | ICD-10-CM | POA: Diagnosis present

## 2020-05-30 LAB — SARS CORONAVIRUS 2 (TAT 6-24 HRS): SARS Coronavirus 2: NEGATIVE

## 2020-05-30 NOTE — Discharge Instructions (Signed)

## 2020-06-01 ENCOUNTER — Encounter
Admission: RE | Disposition: A | Discharge status: Home / Self Care | Payer: Self-pay | Source: Home / Self Care | Attending: Ophthalmology

## 2020-06-01 ENCOUNTER — Ambulatory Visit: Payer: Medicare Other | Admitting: Anesthesiology

## 2020-06-01 ENCOUNTER — Ambulatory Visit
Admission: RE | Admit: 2020-06-01 | Discharge: 2020-06-01 | Disposition: A | Discharge status: Home / Self Care | Payer: Medicare Other | Attending: Ophthalmology | Admitting: Ophthalmology

## 2020-06-01 ENCOUNTER — Encounter: Payer: Self-pay | Admitting: Ophthalmology

## 2020-06-01 ENCOUNTER — Other Ambulatory Visit: Payer: Self-pay

## 2020-06-01 DIAGNOSIS — H2511 Age-related nuclear cataract, right eye: Secondary | ICD-10-CM | POA: Insufficient documentation

## 2020-06-01 DIAGNOSIS — Z9842 Cataract extraction status, left eye: Secondary | ICD-10-CM | POA: Insufficient documentation

## 2020-06-01 DIAGNOSIS — I10 Essential (primary) hypertension: Secondary | ICD-10-CM | POA: Diagnosis not present

## 2020-06-01 HISTORY — PX: CATARACT EXTRACTION W/PHACO: SHX586

## 2020-06-01 SURGERY — PHACOEMULSIFICATION, CATARACT, WITH IOL INSERTION
Anesthesia: Monitor Anesthesia Care | Site: Eye | Laterality: Right

## 2020-06-01 MED ORDER — EPINEPHRINE PF 1 MG/ML IJ SOLN
INTRAOCULAR | Status: DC | PRN
  Administered 2020-06-01: 52 mL via OPHTHALMIC

## 2020-06-01 MED ORDER — TETRACAINE HCL 0.5 % OP SOLN
1.0000 [drp] | OPHTHALMIC | Status: DC | PRN
  Administered 2020-06-01 (×3): 1 [drp] via OPHTHALMIC

## 2020-06-01 MED ORDER — MIDAZOLAM HCL 2 MG/2ML IJ SOLN
INTRAMUSCULAR | Status: DC | PRN
  Administered 2020-06-01: 1 mg via INTRAVENOUS

## 2020-06-01 MED ORDER — BRIMONIDINE TARTRATE-TIMOLOL 0.2-0.5 % OP SOLN
OPHTHALMIC | Status: DC | PRN
  Administered 2020-06-01: 1 [drp] via OPHTHALMIC

## 2020-06-01 MED ORDER — CEFUROXIME OPHTHALMIC INJECTION 1 MG/0.1 ML
INJECTION | OPHTHALMIC | Status: DC | PRN
  Administered 2020-06-01: 0.1 mL via INTRACAMERAL

## 2020-06-01 MED ORDER — LIDOCAINE HCL (PF) 2 % IJ SOLN
INTRAOCULAR | Status: DC | PRN
  Administered 2020-06-01: 1 mL

## 2020-06-01 MED ORDER — ARMC OPHTHALMIC DILATING DROPS
1.0000 "application " | OPHTHALMIC | Status: DC | PRN
  Administered 2020-06-01 (×3): 1 via OPHTHALMIC

## 2020-06-01 MED ORDER — MOXIFLOXACIN HCL 0.5 % OP SOLN
1.0000 [drp] | OPHTHALMIC | Status: DC | PRN
  Administered 2020-06-01 (×3): 1 [drp] via OPHTHALMIC

## 2020-06-01 MED ORDER — LACTATED RINGERS IV SOLN: INTRAVENOUS | Status: DC

## 2020-06-01 MED ORDER — FENTANYL CITRATE (PF) 100 MCG/2ML IJ SOLN
INTRAMUSCULAR | Status: DC | PRN
  Administered 2020-06-01: 50 ug via INTRAVENOUS

## 2020-06-01 MED ORDER — NA HYALUR & NA CHOND-NA HYALUR 0.4-0.35 ML IO KIT
PACK | INTRAOCULAR | Status: DC | PRN
  Administered 2020-06-01: 1 mL via INTRAOCULAR

## 2020-06-01 SURGICAL SUPPLY — 29 items
CANNULA ANT/CHMB 27G (MISCELLANEOUS) ×1 IMPLANT
CANNULA ANT/CHMB 27GA (MISCELLANEOUS) ×2 IMPLANT
GLOVE SURG LX 7.5 STRW (GLOVE) ×3
GLOVE SURG LX STRL 7.5 STRW (GLOVE) ×1 IMPLANT
GLOVE SURG TRIUMPH 8.0 PF LTX (GLOVE) ×2 IMPLANT
GOWN STRL REUS W/ TWL LRG LVL3 (GOWN DISPOSABLE) ×2 IMPLANT
GOWN STRL REUS W/TWL LRG LVL3 (GOWN DISPOSABLE) ×6
LENS IOL DIOP 20.0 (Intraocular Lens) ×2 IMPLANT
LENS IOL TECNIS MONO 20.0 (Intraocular Lens) IMPLANT
MARKER SKIN DUAL TIP RULER LAB (MISCELLANEOUS) ×2 IMPLANT
NDL CAPSULORHEX 25GA (NEEDLE) ×1 IMPLANT
NDL FILTER BLUNT 18X1 1/2 (NEEDLE) ×2 IMPLANT
NDL RETROBULBAR .5 NSTRL (NEEDLE) IMPLANT
NEEDLE CAPSULORHEX 25GA (NEEDLE) ×2 IMPLANT
NEEDLE FILTER BLUNT 18X 1/2SAF (NEEDLE) ×2
NEEDLE FILTER BLUNT 18X1 1/2 (NEEDLE) ×2 IMPLANT
PACK CATARACT BRASINGTON (MISCELLANEOUS) ×2 IMPLANT
PACK EYE AFTER SURG (MISCELLANEOUS) ×2 IMPLANT
PACK OPTHALMIC (MISCELLANEOUS) ×2 IMPLANT
RING MALYGIN 7.0 (MISCELLANEOUS) IMPLANT
SOLUTION OPHTHALMIC SALT (MISCELLANEOUS) ×2 IMPLANT
SUT ETHILON 10-0 CS-B-6CS-B-6 (SUTURE)
SUT VICRYL  9 0 (SUTURE)
SUT VICRYL 9 0 (SUTURE) IMPLANT
SUTURE EHLN 10-0 CS-B-6CS-B-6 (SUTURE) IMPLANT
SYR 3ML LL SCALE MARK (SYRINGE) ×4 IMPLANT
SYR TB 1ML LUER SLIP (SYRINGE) ×2 IMPLANT
WATER STERILE IRR 250ML POUR (IV SOLUTION) ×2 IMPLANT
WIPE NON LINTING 3.25X3.25 (MISCELLANEOUS) ×2 IMPLANT

## 2020-06-01 NOTE — Anesthesia Postprocedure Evaluation (Signed)
Anesthesia Post Note  Patient: Catherine Smith  Procedure(s) Performed: CATARACT EXTRACTION PHACO AND INTRAOCULAR LENS PLACEMENT (IOC) RIGHT (Right Eye)     Anesthesia Post Evaluation No complications documented.  Raijon Lindfors [employer]

## 2020-06-01 NOTE — Anesthesia Preprocedure Evaluation (Signed)
Anesthesia Evaluation  Patient identified by MRN, date of birth, ID band Patient awake    Reviewed: Allergy & Precautions, H&P , NPO status , Patient's Chart, lab work & pertinent test results, reviewed documented beta blocker date and time   Airway Mallampati: II  TM Distance: >3 FB Neck ROM: full    Dental no notable dental hx.    Pulmonary neg pulmonary ROS,    Pulmonary exam normal breath sounds clear to auscultation       Cardiovascular Exercise Tolerance: Good hypertension, Normal cardiovascular exam Rhythm:regular Rate:Normal     Neuro/Psych negative neurological ROS  negative psych ROS   GI/Hepatic negative GI ROS, Neg liver ROS,   Endo/Other  Hypothyroidism   Renal/GU negative Renal ROS  negative genitourinary   Musculoskeletal negative musculoskeletal ROS (+)   Abdominal Normal abdominal exam  (+)   Peds  Hematology negative hematology ROS (+)   Anesthesia Other Findings   Reproductive/Obstetrics negative OB ROS                             Anesthesia Physical  Anesthesia Plan  ASA: II  Anesthesia Plan: MAC   Post-op Pain Management:    Induction: Intravenous  PONV Risk Score and Plan: 2 and TIVA, Midazolam and Treatment may vary due to age or medical condition  Airway Management Planned: Nasal Cannula  Additional Equipment: None  Intra-op Plan:   Post-operative Plan:   Informed Consent: I have reviewed the patients History and Physical, chart, labs and discussed the procedure including the risks, benefits and alternatives for the proposed anesthesia with the patient or authorized representative who has indicated his/her understanding and acceptance.     Dental Advisory Given  Plan Discussed with: CRNA  Anesthesia Plan Comments:         Anesthesia Quick Evaluation

## 2020-06-01 NOTE — Op Note (Signed)
LOCATION:  [facility]   PREOPERATIVE DIAGNOSIS:    Nuclear sclerotic cataract right eye. H25.11   POSTOPERATIVE DIAGNOSIS:  Nuclear sclerotic cataract right eye.     PROCEDURE:  Phacoemusification with posterior chamber intraocular lens placement of the right eye   ULTRASOUND TIME: Procedure(s): CATARACT EXTRACTION PHACO AND INTRAOCULAR LENS PLACEMENT (IOC) RIGHT (Right)  LENS:   Implant Name Type Inv. Item Serial No. Manufacturer Lot No. LRB No. Used Action  LENS IOL DIOP 20.0 - [id number] Intraocular Lens LENS IOL DIOP 20.0 [id number] [employer]  Right 1 Implanted      [place] 10% of 0:54  CDE 5.5   SURGEON:  Wyonia Hough, MD   ANESTHESIA:  Topical with tetracaine drops and 2% Xylocaine jelly, augmented with 1% preservative-free intracameral lidocaine.    COMPLICATIONS:  None.   DESCRIPTION OF PROCEDURE:  The patient was identified in the holding room and transported to the operating room and placed in the supine position under the operating microscope.  The right eye was identified as the operative eye and it was prepped and draped in the usual sterile ophthalmic fashion.   A 1 millimeter clear-corneal paracentesis was made at the 12:00 position.  0.5 ml of preservative-free 1% lidocaine was injected into the anterior chamber. The anterior chamber was filled with Viscoat viscoelastic.  A 2.4 millimeter keratome was used to make a near-clear corneal incision at the 9:00 position.  A curvilinear capsulorrhexis was made with a cystotome and capsulorrhexis forceps.  Balanced salt solution was used to hydrodissect and hydrodelineate the nucleus.   Phacoemulsification was then used in stop and chop fashion to remove the lens nucleus and epinucleus.  The remaining cortex was then removed using the irrigation and aspiration handpiece. Provisc was then placed into the capsular bag to distend it for lens placement.  A lens was then injected into the capsular bag.   The remaining viscoelastic was aspirated.   Wounds were hydrated with balanced salt solution.  The anterior chamber was inflated to a physiologic pressure with balanced salt solution.  No wound leaks were noted. Cefuroxime 0.1 ml of a 10mg /ml solution was injected into the anterior chamber for a dose of 1 mg of intracameral antibiotic at the completion of the case.   Timolol and Brimonidine drops were applied to the eye.  The patient was taken to the recovery room in stable condition without complications of anesthesia or surgery.   Duston Smolenski 06/01/2020, 12:12 PM

## 2020-06-01 NOTE — Anesthesia Procedure Notes (Signed)
Procedure Name: MAC Date/Time: 06/01/2020 11:58 AM Performed by: Silvana Newness, CRNA Pre-anesthesia Checklist: Patient identified, Emergency Drugs available, Suction available, Patient being monitored and Timeout performed Patient Re-evaluated:Patient Re-evaluated prior to induction Oxygen Delivery Method: Nasal cannula Placement Confirmation: positive ETCO2

## 2020-06-01 NOTE — Transfer of Care (Signed)
Immediate Anesthesia Transfer of Care Note  Patient: Catherine Smith  Procedure(s) Performed: CATARACT EXTRACTION PHACO AND INTRAOCULAR LENS PLACEMENT (IOC) RIGHT (Right Eye)  Patient Location: PACU  Anesthesia Type: MAC  Level of Consciousness: awake, alert  and patient cooperative  Airway and Oxygen Therapy: Patient Spontanous Breathing and Patient connected to supplemental oxygen  Post-op Assessment: Post-op Vital signs reviewed, Patient's Cardiovascular Status Stable, Respiratory Function Stable, Patent Airway and No signs of Nausea or vomiting  Post-op Vital Signs: Reviewed and stable  Complications: No complications documented.

## 2020-06-01 NOTE — H&P (Signed)

## 2020-06-02 ENCOUNTER — Encounter: Payer: Self-pay | Admitting: Ophthalmology

## 2020-11-29 ENCOUNTER — Other Ambulatory Visit: Payer: Self-pay | Admitting: Internal Medicine

## 2020-11-29 DIAGNOSIS — Z1231 Encounter for screening mammogram for malignant neoplasm of breast: Secondary | ICD-10-CM

## 2020-12-16 ENCOUNTER — Ambulatory Visit
Admission: RE | Admit: 2020-12-16 | Discharge: 2020-12-16 | Disposition: A | Discharge status: Home / Self Care | Payer: Medicare Other | Source: Ambulatory Visit | Attending: Internal Medicine | Admitting: Internal Medicine

## 2020-12-16 ENCOUNTER — Other Ambulatory Visit: Payer: Self-pay

## 2020-12-16 DIAGNOSIS — Z1231 Encounter for screening mammogram for malignant neoplasm of breast: Secondary | ICD-10-CM | POA: Diagnosis present

## 2021-09-15 ENCOUNTER — Other Ambulatory Visit: Payer: Self-pay | Admitting: Internal Medicine

## 2021-09-15 DIAGNOSIS — N183 Chronic kidney disease, stage 3 unspecified: Secondary | ICD-10-CM

## 2021-09-22 ENCOUNTER — Ambulatory Visit
Admission: RE | Admit: 2021-09-22 | Discharge: 2021-09-22 | Disposition: A | Discharge status: Home / Self Care | Payer: Medicare Other | Source: Ambulatory Visit | Attending: Internal Medicine | Admitting: Internal Medicine

## 2021-09-22 ENCOUNTER — Other Ambulatory Visit: Payer: Self-pay

## 2021-09-22 DIAGNOSIS — N183 Chronic kidney disease, stage 3 unspecified: Secondary | ICD-10-CM | POA: Insufficient documentation

## 2021-11-16 ENCOUNTER — Other Ambulatory Visit: Payer: Self-pay | Admitting: Internal Medicine

## 2021-11-16 DIAGNOSIS — Z1231 Encounter for screening mammogram for malignant neoplasm of breast: Secondary | ICD-10-CM

## 2021-12-18 ENCOUNTER — Other Ambulatory Visit: Payer: Self-pay | Admitting: Internal Medicine

## 2021-12-18 DIAGNOSIS — K219 Gastro-esophageal reflux disease without esophagitis: Secondary | ICD-10-CM

## 2021-12-20 ENCOUNTER — Ambulatory Visit
Admission: RE | Admit: 2021-12-20 | Discharge: 2021-12-20 | Disposition: A | Discharge status: Home / Self Care | Payer: Medicare Other | Source: Ambulatory Visit | Attending: Internal Medicine | Admitting: Internal Medicine

## 2021-12-20 ENCOUNTER — Other Ambulatory Visit: Payer: Self-pay

## 2021-12-20 DIAGNOSIS — Z1231 Encounter for screening mammogram for malignant neoplasm of breast: Secondary | ICD-10-CM | POA: Diagnosis not present

## 2021-12-29 ENCOUNTER — Other Ambulatory Visit: Payer: Self-pay

## 2021-12-29 ENCOUNTER — Ambulatory Visit
Admission: RE | Admit: 2021-12-29 | Discharge: 2021-12-29 | Disposition: A | Discharge status: Home / Self Care | Payer: Medicare Other | Source: Ambulatory Visit | Attending: Internal Medicine | Admitting: Internal Medicine

## 2021-12-29 DIAGNOSIS — K219 Gastro-esophageal reflux disease without esophagitis: Secondary | ICD-10-CM | POA: Diagnosis present

## 2021-12-29 DIAGNOSIS — K449 Diaphragmatic hernia without obstruction or gangrene: Secondary | ICD-10-CM | POA: Diagnosis present

## 2022-10-25 ENCOUNTER — Other Ambulatory Visit: Payer: Self-pay | Admitting: Internal Medicine

## 2022-10-25 DIAGNOSIS — Z1231 Encounter for screening mammogram for malignant neoplasm of breast: Secondary | ICD-10-CM

## 2022-12-09 IMAGING — MG MM DIGITAL SCREENING BILAT W/ TOMO AND CAD
8 series · 8 of 24 positions shown · non-contrast
Comparison: Previous exam(s).

CLINICAL DATA: Screening.

EXAM:
DIGITAL SCREENING BILATERAL MAMMOGRAM WITH TOMOSYNTHESIS AND CAD
TECHNIQUE: Bilateral screening digital craniocaudal and mediolateral oblique
mammograms were obtained. Bilateral screening digital breast
tomosynthesis was performed. The images were evaluated with
computer-aided detection.

[L MLO synth-2D]
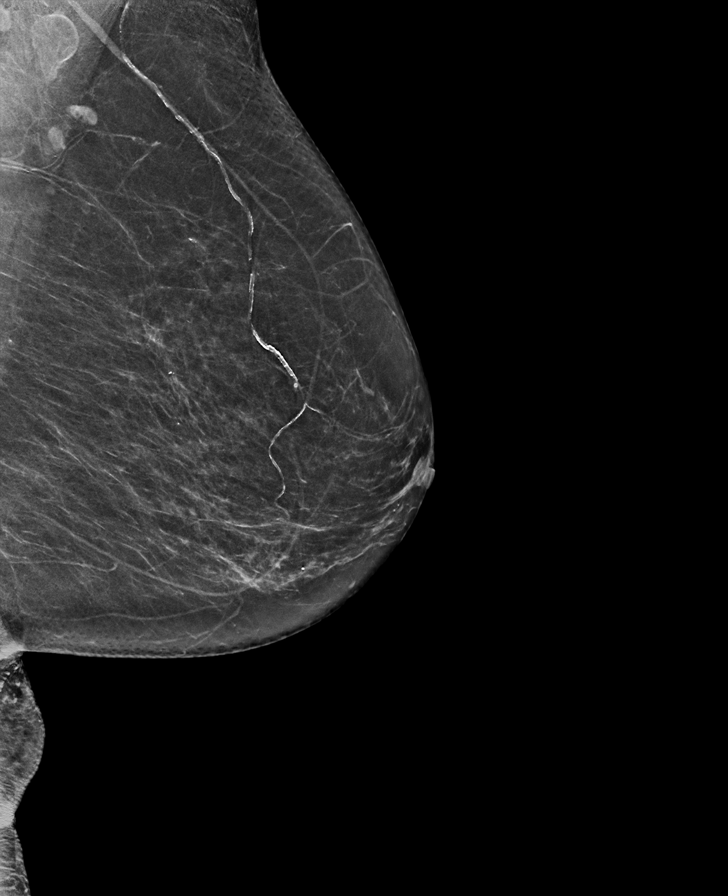

[R CC synth-2D]
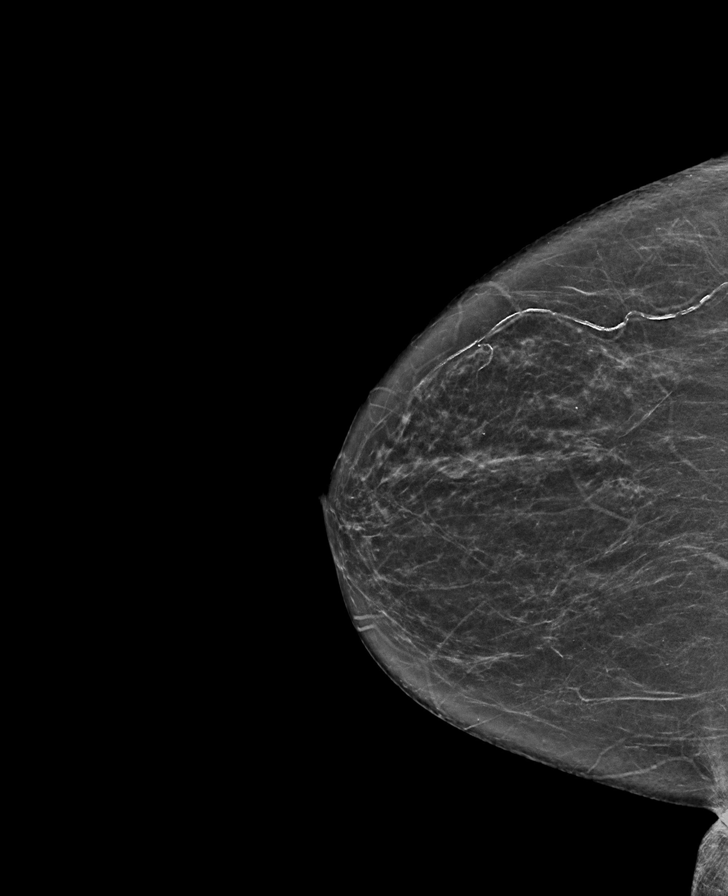

[L CC synth-2D]
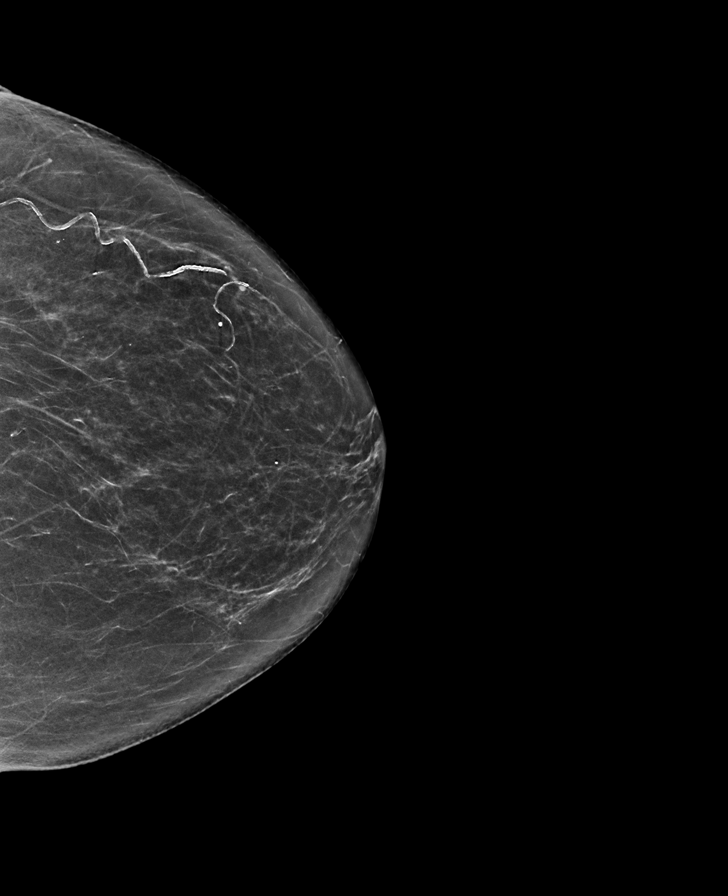

[R MLO synth-2D]
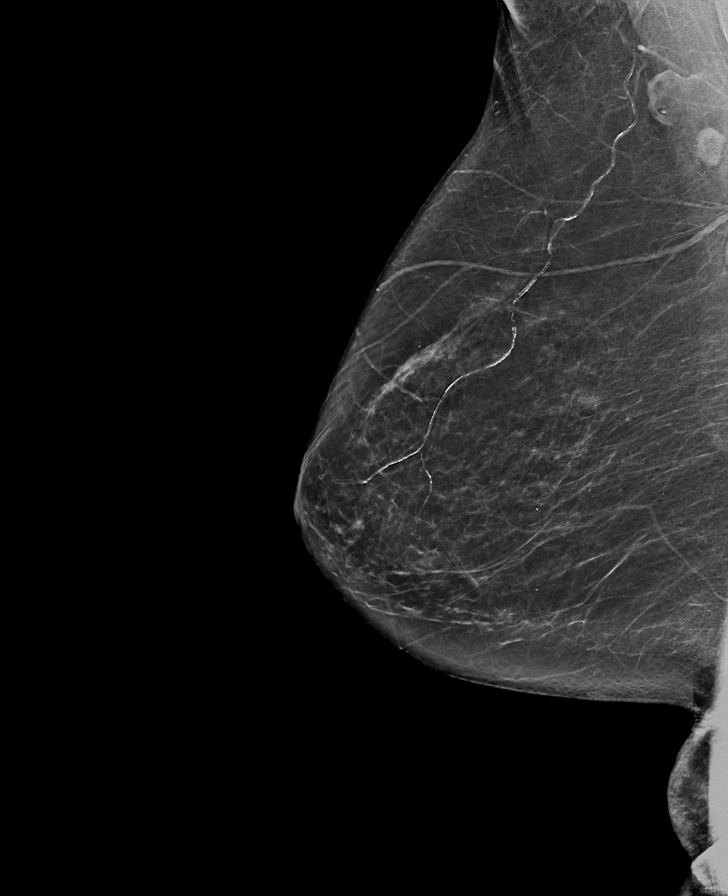

[L CC tomo · tomo slice 34/67.0]
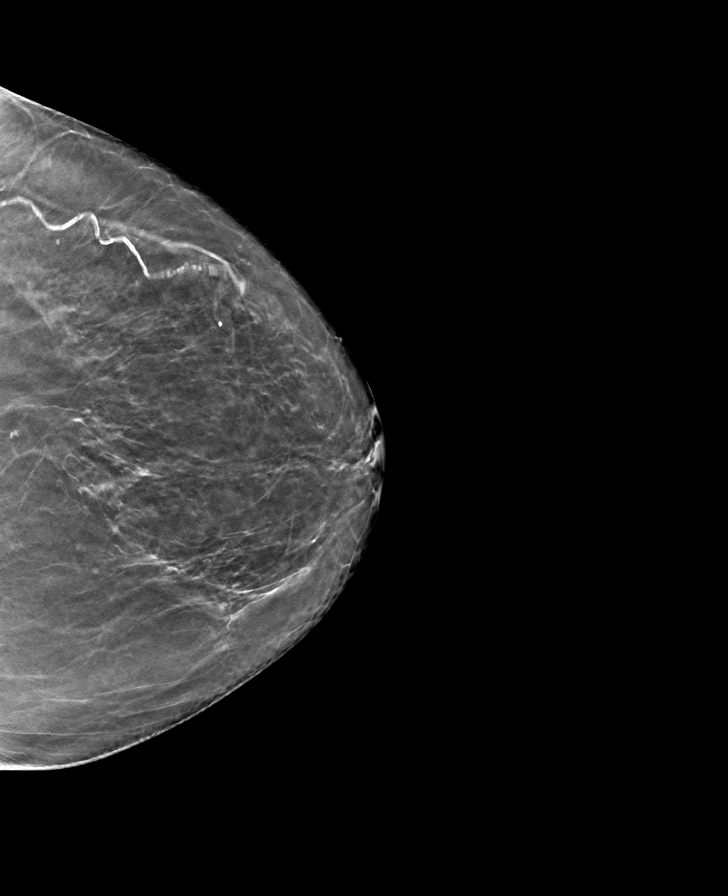

[R CC tomo · tomo slice 32/63.0]
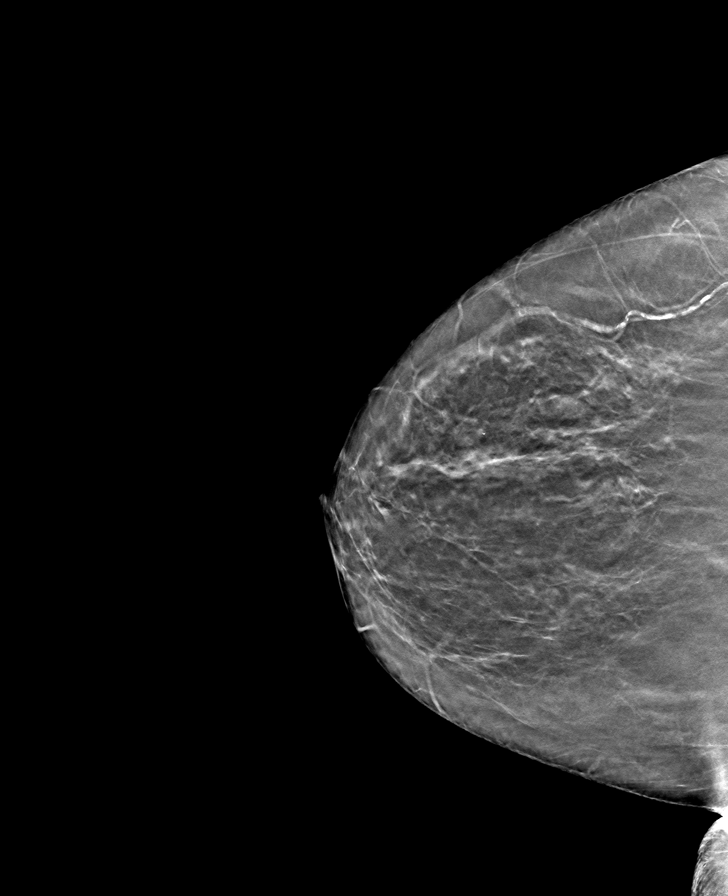

[L MLO tomo · tomo slice 33/64.0]
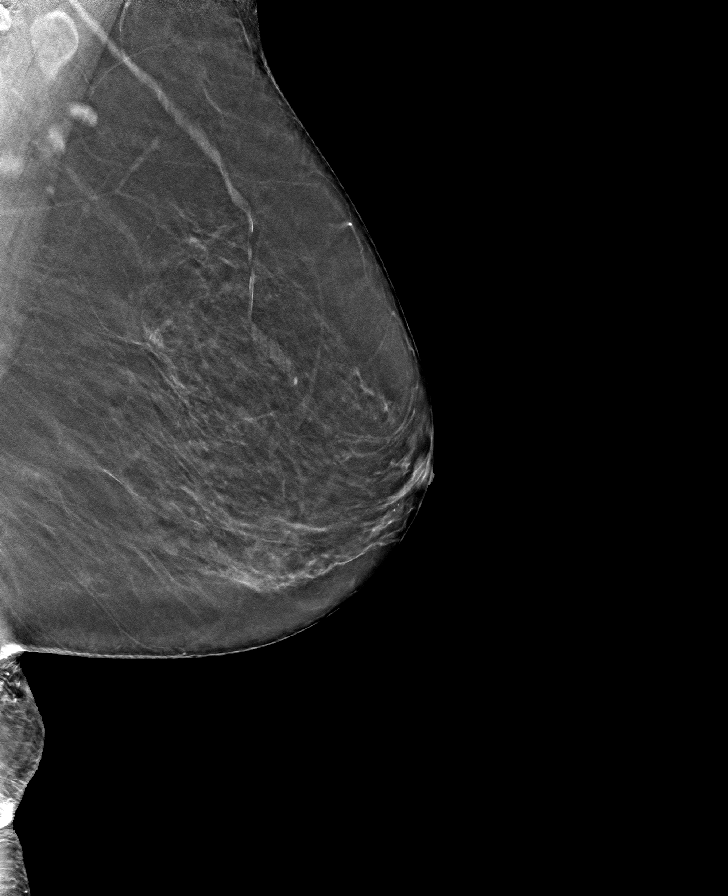

[R MLO tomo · tomo slice 35/68.0]
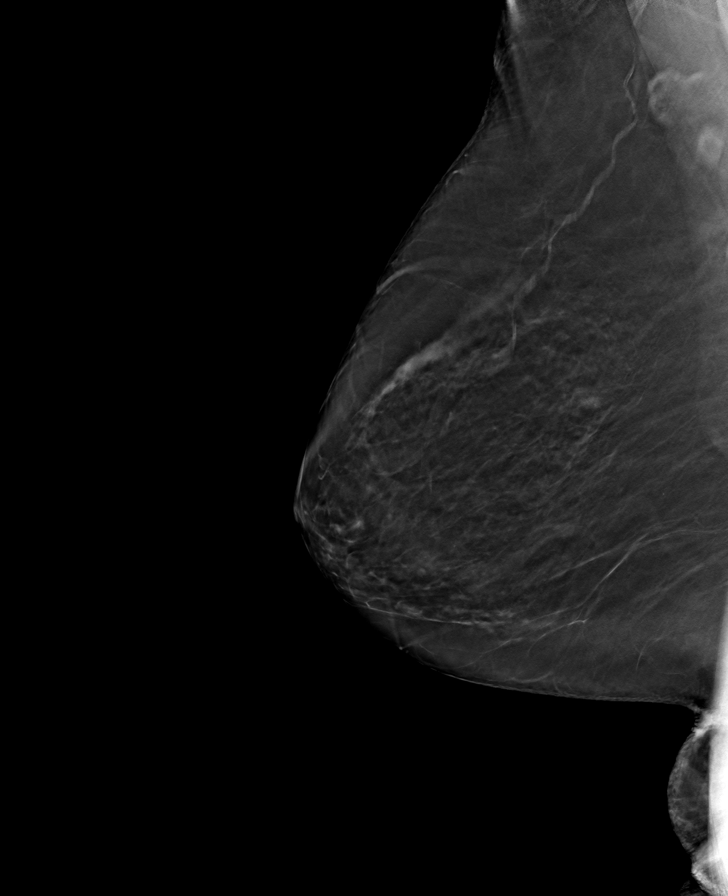

[8 of 24 positions shown; findings below may reference images not displayed]

ACR Breast Density Category b: There are scattered areas of
fibroglandular density.
FINDINGS: There are no findings suspicious for malignancy. The images were
evaluated with computer-aided detection.
IMPRESSION: No mammographic evidence of malignancy. A result letter of this
screening mammogram will be mailed directly to the patient.

RECOMMENDATION:
Screening mammogram in one year. (Code:[id number])

BI-RADS CATEGORY  1: Negative.

## 2022-12-24 ENCOUNTER — Ambulatory Visit
Admission: RE | Admit: 2022-12-24 | Discharge: 2022-12-24 | Disposition: A | Discharge status: Home / Self Care | Payer: Medicare Other | Source: Ambulatory Visit | Attending: Internal Medicine | Admitting: Internal Medicine

## 2022-12-24 DIAGNOSIS — Z1231 Encounter for screening mammogram for malignant neoplasm of breast: Secondary | ICD-10-CM | POA: Insufficient documentation

## 2023-01-23 ENCOUNTER — Encounter: Payer: Self-pay | Admitting: Cardiovascular Disease

## 2023-01-23 NOTE — Progress Notes (Unsigned)
Cardiology Office Note:    Date:  01/24/2023   ID:  Gwenevere Ghazi, DOB 03/04/1941, MRN [id number]  PCP:  Marguarite Arbour, MD    [facility] Providers Cardiologist:  Jozsef Wescoat  Click to update primary MD,subspecialty MD or APP then REFRESH:1}    Referring MD: Marguarite Arbour, MD   Chief Complaint  Patient presents with   Mitral Regurgitation    History of Present Illness:    Catherine Smith is a 82 y.o. female with a hx of mitral regurgitation, HLD, Seen with daughter,  Almira Coaster  Has been seen by Dr. Darrold Junker at [facility]  and is here for 2nd opinion   Has a history of mild mitral regurgitation, mild aortic insufficiency, severe tricuspid regurgitation.  Moderately dilated RV . She has elevated RV systolic pressure of 35 mmHg.  Had a hialtal hernia Was repaired at [facility] Aug. 31, 2023  .   No dyspnea Occasional palpitations   Felt some unusual weakness / clammy feeling Had been working outside. Felt better after liquid IV   Does all that she wants to do for the most part  Non smoker,   Eats a low salt diet , some chips     Past Medical History:  Diagnosis Date   Bimalleolar ankle fracture Feb 17 2011   surgery Dr. Olga Millers and Jarold Motto, [facility]   Hyperlipidemia    Hypothyroidism    Pap smear for cervical cancer screening 2010   normal   Screening for breast cancer 2011   normal   Thyroid disease     Past Surgical History:  Procedure Laterality Date   BACK SURGERY  07/15/12   Herniated Disc L5, Sliver of bone protruding into sciatic nerve   bilateral ankle fusion     BREAST CYST EXCISION Right 1970   benign   BUNIONECTOMY     CATARACT EXTRACTION W/PHACO Left 04/27/2020   Procedure: CATARACT EXTRACTION PHACO AND INTRAOCULAR LENS PLACEMENT (IOC) LEFT 5.65  01:03.1  9.0%;  Surgeon: Lockie Mola, MD;  Location: [facility];  Service: Ophthalmology;  Laterality: Left;   CATARACT EXTRACTION W/PHACO Right 06/01/2020   Procedure: CATARACT  EXTRACTION PHACO AND INTRAOCULAR LENS PLACEMENT (IOC) RIGHT;  Surgeon: Lockie Mola, MD;  Location: [facility];  Service: Ophthalmology;  Laterality: Right;  5.46 0:53.9 10.2%   COLONOSCOPY     COLONOSCOPY WITH PROPOFOL N/A 10/14/2018   Procedure: COLONOSCOPY WITH PROPOFOL;  Surgeon: Midge Minium, MD;  Location: [facility];  Service: Endoscopy;  Laterality: N/A;   ESOPHAGOGASTRODUODENOSCOPY     SPINE SURGERY  1994   cervical disk 3through 6, [facility]   TRIGGER FINGER RELEASE  1984   left  hand   VAGINAL HYSTERECTOMY  1976    Current Medications: Current Meds  Medication Sig   Ascorbic Acid (VITAMIN C) 500 MG CAPS Take by mouth daily.   aspirin 81 MG tablet Take 81 mg by mouth daily.     Biotin 10 MG TABS Take by mouth daily.   bisoprolol (ZEBETA) 5 MG tablet Take 5 mg by mouth daily.   calcium carbonate (OSCAL) 1500 (600 Ca) MG TABS tablet Take by mouth daily.   Cholecalciferol (VITAMIN D3) 2000 UNITS TABS Take 1 tablet by mouth daily.   cyanocobalamin (,VITAMIN B-12,) 1000 MCG/ML injection INJECT 1 ML (1,000 MCG TOTAL) INTO THE MUSCLE EVERY 30 DAYS.   levothyroxine (SYNTHROID) 75 MCG tablet Take 75 mcg by mouth daily. Skips one day a week   magnesium oxide (MAG-OX)  400 MG tablet Take 500 mg by mouth daily.    simvastatin (ZOCOR) 20 MG tablet Take 1 tablet (20 mg total) by mouth at bedtime.   [DISCONTINUED] levothyroxine (SYNTHROID, LEVOTHROID) 50 MCG tablet Take 1 tablet (50 mcg total) by mouth daily before breakfast. TAKE ONE TABLET BY MOUTH EVERY MORNING     Allergies:   Adhesive [tape] and Latex   Social History   Socioeconomic History   Marital status: Widowed    Spouse name: Not on file   Number of children: Not on file   Years of education: Not on file   Highest education level: Not on file  Occupational History   Not on file  Tobacco Use   Smoking status: Never   Smokeless tobacco: Never  Vaping Use   Vaping Use: Never used  Substance  and Sexual Activity   Alcohol use: No   Drug use: No   Sexual activity: Not on file  Other Topics Concern   Not on file  Social History Narrative   Not on file   Social Determinants of Health   Financial Resource Strain: Not on file  Food Insecurity: Not on file  Transportation Needs: Not on file  Physical Activity: Not on file  Stress: Not on file  Social Connections: Not on file     Family History: The patient's family history includes COPD in her brother and sister; Cancer (age of onset: 23) in her mother; Heart disease in her brother and mother; Heart disease (age of onset: 85) in her father; Stroke (age of onset: 19) in her mother. There is no history of Breast cancer.  ROS:   Please see the history of present illness.     All other systems reviewed and are negative.  EKGs/Labs/Other Studies Reviewed:    The following studies were reviewed today:   EKG:    January 24, 2023:   NSR at 4.  Normal ECG   Recent Labs: No results found for requested labs within last 365 days.  Recent Lipid Panel    Component Value Date/Time   CHOL 180 08/16/2015 1012   TRIG 83.0 08/16/2015 1012   HDL 62.80 08/16/2015 1012   CHOLHDL 3 08/16/2015 1012   VLDL 16.6 08/16/2015 1012   LDLCALC 101 (H) 08/16/2015 1012   LDLDIRECT 110.6 12/09/2012 1055     Risk Assessment/Calculations:                Physical Exam:    VS:  BP 122/78   Pulse 64   Ht 5\' 3"  (1.6 m)   Wt 144 lb (65.3 kg)   SpO2 97%   BMI 25.51 kg/m     Wt Readings from Last 3 Encounters:  01/24/23 144 lb (65.3 kg)  06/01/20 141 lb (64 kg)  04/27/20 140 lb (63.5 kg)     GEN:  Well nourished, well developed in no acute distress HEENT: Normal NECK: No JVD; No carotid bruits LYMPHATICS: No lymphadenopathy CARDIAC: RRR, no murmurs, rubs, gallops ( no murmurs heard today on exam )  RESPIRATORY:  Clear to auscultation without rales, wheezing or rhonchi  ABDOMEN: Soft, non-tender, non-distended MUSCULOSKELETAL:   No edema; No deformity  SKIN: Warm and dry NEUROLOGIC:  Alert and oriented x 3 PSYCHIATRIC:  Normal affect   ASSESSMENT:    1. Tricuspid valve insufficiency, unspecified etiology    PLAN:    In order of problems listed above:  History of tricuspid regurgitation/mitral regurgitation: I did not hear any significant  murmur on exam today.  Will plan on repeating her echocardiogram when I see her again in 1 year.  She is not having any symptoms. Not anticipate changing any medications.  2.  Episode of orthostatic hypotension: She had an episode of dizziness while working outside last week.  I suspect that she was volume depleted.  She has had hiatal hernia surgery show she is not able to drink large volumes of fluid.  I suggest that she drink small volumes of fluid constantly while she is working outside.            Medication Adjustments/Labs and Tests Ordered: Current medicines are reviewed at length with the patient today.  Concerns regarding medicines are outlined above.  Orders Placed This Encounter  Procedures   EKG 12-Lead   No orders of the defined types were placed in this encounter.   Patient Instructions  Medication Instructions:  Your physician recommends that you continue on your current medications as directed. Please refer to the Current Medication list given to you today. *If you need a refill on your cardiac medications before your next appointment, please call your pharmacy*   Testing/Procedures: Echocardiogram Your physician has requested that you have an echocardiogram. Echocardiography is a painless test that uses sound waves to create images of your heart. It provides your doctor with information about the size and shape of your heart and how well your heart's chambers and valves are working. This procedure takes approximately one hour. There are no restrictions for this procedure. Please do NOT wear cologne, perfume, aftershave, or lotions (deodorant is  allowed). Please arrive 15 minutes prior to your appointment time.   Follow-Up: At [facility], you and your health needs are our priority.  As part of our continuing mission to provide you with exceptional heart care, we have created designated Provider Care Teams.  These Care Teams include your primary Cardiologist (physician) and Advanced Practice Providers (APPs -  Physician Assistants and Nurse Practitioners) who all work together to provide you with the care you need, when you need it.  We recommend signing up for the patient portal called "MyChart".  Sign up information is provided on this After Visit Summary.  MyChart is used to connect with patients for Virtual Visits (Telemedicine).  Patients are able to view lab/test results, encounter notes, upcoming appointments, etc.  Non-urgent messages can be sent to your provider as well.   To learn more about what you can do with MyChart, go to [email or web address].    Your next appointment:   1 year(s)  Provider:   Kristeen MissPhilip Jacynda Brunke, MD   Signed, Kristeen MissPhilip Cuthbert Turton, MD  01/24/2023 3:01 PM    [facility]

## 2023-01-24 ENCOUNTER — Encounter: Payer: Self-pay | Admitting: Cardiovascular Disease

## 2023-01-24 ENCOUNTER — Ambulatory Visit: Payer: Medicare Other | Attending: Cardiovascular Disease | Admitting: Cardiovascular Disease

## 2023-01-24 VITALS — BP 122/78 | HR 64 | Ht 63.0 in | Wt 144.0 lb

## 2023-01-24 DIAGNOSIS — I071 Rheumatic tricuspid insufficiency: Secondary | ICD-10-CM | POA: Insufficient documentation

## 2023-01-24 NOTE — Addendum Note (Signed)
Addended by: Sherle Poe R on: 01/24/2023 03:03 PM   Modules accepted: Orders

## 2023-01-24 NOTE — Patient Instructions (Signed)
Medication Instructions:  Your physician recommends that you continue on your current medications as directed. Please refer to the Current Medication list given to you today. *If you need a refill on your cardiac medications before your next appointment, please call your pharmacy*   Testing/Procedures: Echocardiogram Your physician has requested that you have an echocardiogram. Echocardiography is a painless test that uses sound waves to create images of your heart. It provides your doctor with information about the size and shape of your heart and how well your heart's chambers and valves are working. This procedure takes approximately one hour. There are no restrictions for this procedure. Please do NOT wear cologne, perfume, aftershave, or lotions (deodorant is allowed). Please arrive 15 minutes prior to your appointment time.    Follow-Up: At [facility], you and your health needs are our priority.  As part of our continuing mission to provide you with exceptional heart care, we have created designated Provider Care Teams.  These Care Teams include your primary Cardiologist (physician) and Advanced Practice Providers (APPs -  Physician Assistants and Nurse Practitioners) who all work together to provide you with the care you need, when you need it.  We recommend signing up for the patient portal called "MyChart".  Sign up information is provided on this After Visit Summary.  MyChart is used to connect with patients for Virtual Visits (Telemedicine).  Patients are able to view lab/test results, encounter notes, upcoming appointments, etc.  Non-urgent messages can be sent to your provider as well.   To learn more about what you can do with MyChart, go to [email or web address].    Your next appointment:   1 year(s)  Provider:   Philip Nahser, MD   

## 2023-03-05 ENCOUNTER — Encounter: Payer: Self-pay | Admitting: Family Medicine

## 2023-03-05 ENCOUNTER — Ambulatory Visit: Payer: Medicare Other | Admitting: Family Medicine

## 2023-03-05 ENCOUNTER — Ambulatory Visit (INDEPENDENT_AMBULATORY_CARE_PROVIDER_SITE_OTHER): Payer: Medicare Other | Admitting: Family Medicine

## 2023-03-05 VITALS — BP 130/70 | HR 66 | Temp 97.9°F | Ht 64.0 in | Wt 147.1 lb

## 2023-03-05 DIAGNOSIS — R002 Palpitations: Secondary | ICD-10-CM | POA: Diagnosis not present

## 2023-03-05 DIAGNOSIS — Z800 Family history of malignant neoplasm of digestive organs: Secondary | ICD-10-CM

## 2023-03-05 DIAGNOSIS — E559 Vitamin D deficiency, unspecified: Secondary | ICD-10-CM | POA: Diagnosis not present

## 2023-03-05 DIAGNOSIS — E039 Hypothyroidism, unspecified: Secondary | ICD-10-CM

## 2023-03-05 DIAGNOSIS — R0683 Snoring: Secondary | ICD-10-CM

## 2023-03-05 DIAGNOSIS — Z131 Encounter for screening for diabetes mellitus: Secondary | ICD-10-CM

## 2023-03-05 DIAGNOSIS — E785 Hyperlipidemia, unspecified: Secondary | ICD-10-CM | POA: Diagnosis not present

## 2023-03-05 DIAGNOSIS — Z9889 Other specified postprocedural states: Secondary | ICD-10-CM

## 2023-03-05 NOTE — Assessment & Plan Note (Signed)
Chronic stable control on levothyroxine 75 mcg daily. TSH 2.9  10/2022

## 2023-03-05 NOTE — Assessment & Plan Note (Signed)
Mother and sister

## 2023-03-05 NOTE — Assessment & Plan Note (Signed)
Due for re-eval.  On 5000 IU daily

## 2023-03-05 NOTE — Assessment & Plan Note (Signed)
Not sure if she snore. Some fatigue, no Am headaches, no HTN.  No clear indication for sleep study.

## 2023-03-05 NOTE — Addendum Note (Signed)
Addended by: Alvina Chou on: 03/05/2023 12:22 PM   Modules accepted: Orders

## 2023-03-05 NOTE — Progress Notes (Signed)
Patient ID: Catherine Smith, female    DOB: [birth year], 82 y.o.   MRN: [id number]  This visit was conducted in person.  BP 130/70 (BP Location: Right Arm, Patient Position: Sitting, Cuff Size: Normal)   Pulse 66   Temp 97.9 F (36.6 C) (Temporal)   Ht 5\' 4"  (1.626 m)   Wt 147 lb 2 oz (66.7 kg)   SpO2 99%   BMI 25.25 kg/m    CC: No chief complaint on file.   Subjective:   HPI: Catherine Smith is a 82 y.o. female presenting on 03/05/2023 for No chief complaint on file.  Previous PCP: Dr. Corinda Gubler and then Gavin Potters Last physical: > 1 year  GYN none  Vitamin D deficiency: Due for re-eval.  On 5000 IU daily  Hypothyroidism:  Chronic stable control on levothyroxine 75 mcg daily. TSH 2.9    Paroxysmal PVC: Dr Melburn Popper  Last OV reviewed 4/112/2024   mild mitral regurgitation, mild aortic insufficiency, severe tricuspid regurgitation.  Moderately dilated RV . She has elevated RV systolic pressure of 35 mmHg.  Pulmonary HTN    Hyperlipidemia: total Chol 10/2022 162,  Tri 82, , HDL 61, LDL 84  On simvastatin 20 mg daily   Surgery in 2023 for hiatal hernia. Wt Readings from Last 3 Encounters:  03/05/23 147 lb 2 oz (66.7 kg)  01/24/23 144 lb (65.3 kg)  06/01/20 141 lb (64 kg)     Relevant past medical, surgical, family and social history reviewed and updated as indicated. Interim medical history since our last visit reviewed. Allergies and medications reviewed and updated. Outpatient Medications Prior to Visit  Medication Sig Dispense Refill   Ascorbic Acid (VITAMIN C) 500 MG CAPS Take by mouth daily.     aspirin 81 MG tablet Take 81 mg by mouth daily.       Biotin 10 MG TABS Take by mouth daily.     bisoprolol (ZEBETA) 5 MG tablet Take 5 mg by mouth daily.     calcium carbonate (OSCAL) 1500 (600 Ca) MG TABS tablet Take by mouth daily.     Cholecalciferol (VITAMIN D3) 2000 UNITS TABS Take 1 tablet by mouth daily.     cyanocobalamin (,VITAMIN B-12,) 1000 MCG/ML injection INJECT 1 ML  (1,000 MCG TOTAL) INTO THE MUSCLE EVERY 30 DAYS. 10 mL 1   levothyroxine (SYNTHROID) 75 MCG tablet Take 75 mcg by mouth daily. Skips one day a week     magnesium oxide (MAG-OX) 400 MG tablet Take 500 mg by mouth daily.      Multiple Vitamins-Minerals (MULTIVITAMIN WITH MINERALS) tablet Take 1 tablet by mouth daily.     simvastatin (ZOCOR) 20 MG tablet Take 1 tablet (20 mg total) by mouth at bedtime. 90 tablet 3   No facility-administered medications prior to visit.     Per HPI unless specifically indicated in ROS section below Review of Systems  Constitutional:  Negative for fatigue and fever.  HENT:  Negative for congestion.   Eyes:  Negative for pain.  Respiratory:  Negative for cough and shortness of breath.   Cardiovascular:  Negative for chest pain, palpitations and leg swelling.  Gastrointestinal:  Negative for abdominal pain.  Genitourinary:  Negative for dysuria and vaginal bleeding.  Musculoskeletal:  Negative for back pain.  Neurological:  Negative for syncope, light-headedness and headaches.  Psychiatric/Behavioral:  Negative for dysphoric mood.    Objective:  BP 130/70 (BP Location: Right Arm, Patient Position: Sitting, Cuff Size: Normal)   Pulse 66  Temp 97.9 F (36.6 C) (Temporal)   Ht 5\' 4"  (1.626 m)   Wt 147 lb 2 oz (66.7 kg)   SpO2 99%   BMI 25.25 kg/m   Wt Readings from Last 3 Encounters:  03/05/23 147 lb 2 oz (66.7 kg)  01/24/23 144 lb (65.3 kg)  06/01/20 141 lb (64 kg)      Physical Exam Constitutional:      General: She is not in acute distress.    Appearance: Normal appearance. She is well-developed. She is not ill-appearing or toxic-appearing.  HENT:     Head: Normocephalic.     Right Ear: Hearing, tympanic membrane, ear canal and external ear normal. Tympanic membrane is not erythematous, retracted or bulging.     Left Ear: Hearing, tympanic membrane, ear canal and external ear normal. Tympanic membrane is not erythematous, retracted or bulging.      Nose: No mucosal edema or rhinorrhea.     Right Sinus: No maxillary sinus tenderness or frontal sinus tenderness.     Left Sinus: No maxillary sinus tenderness or frontal sinus tenderness.     Mouth/Throat:     Pharynx: Uvula midline.  Eyes:     General: Lids are normal. Lids are everted, no foreign bodies appreciated.     Conjunctiva/sclera: Conjunctivae normal.     Pupils: Pupils are equal, round, and reactive to light.  Neck:     Thyroid: No thyroid mass or thyromegaly.     Vascular: No carotid bruit.     Trachea: Trachea normal.  Cardiovascular:     Rate and Rhythm: Normal rate and regular rhythm.     Pulses: Normal pulses.     Heart sounds: Normal heart sounds, S1 normal and S2 normal. No murmur heard.    No friction rub. No gallop.  Pulmonary:     Effort: Pulmonary effort is normal. No tachypnea or respiratory distress.     Breath sounds: Normal breath sounds. No decreased breath sounds, wheezing, rhonchi or rales.  Abdominal:     General: Bowel sounds are normal.     Palpations: Abdomen is soft.     Tenderness: There is no abdominal tenderness.  Musculoskeletal:     Cervical back: Normal range of motion and neck supple.  Skin:    General: Skin is warm and dry.     Findings: No rash.  Neurological:     Mental Status: She is alert.  Psychiatric:        Mood and Affect: Mood is not anxious or depressed.        Speech: Speech normal.        Behavior: Behavior normal. Behavior is cooperative.        Thought Content: Thought content normal.        Judgment: Judgment normal.       Results for orders placed or performed during the hospital encounter of 05/30/20  SARS CORONAVIRUS 2 (TAT 6-24 HRS) Nasopharyngeal Nasopharyngeal Swab   Specimen: Nasopharyngeal Swab  Result Value Ref Range   SARS Coronavirus 2 NEGATIVE NEGATIVE    Assessment and Plan  Vitamin D deficiency Assessment & Plan: Due for re-eval.  On 5000 IU daily  Orders: -     VITAMIN D 25 Hydroxy  (Vit-D Deficiency, Fractures); Future  Primary snoring Assessment & Plan:  Not sure if she snore. Some fatigue, no Am headaches, no HTN.  No clear indication for sleep study.    Palpitations Assessment & Plan:  Chronic frequent PVCs followed by Dr.  N[facility].  Orders: -     CBC with Differential/Platelet  Hyperlipidemia with target LDL less than 100 Assessment & Plan: .Stable, chronic.  Continue current medication.  Simvastatin 20 mg daily  Orders: -     Lipid panel; Future -     Comprehensive metabolic panel; Future  History of back surgery  Family history of colon cancer Assessment & Plan: Mother and sister   Hypothyroidism, unspecified type -     TSH; Future -     T4, free; Future -     T3, free; Future  Screening for diabetes mellitus    Return in about 4 weeks (around 04/02/2023) for  fasting labs then AMW/CPE with me.   Kerby Nora, MD

## 2023-03-05 NOTE — Assessment & Plan Note (Signed)
Stable, chronic.  Continue current medication.  Simvastatin 20 mg daily 

## 2023-03-05 NOTE — Assessment & Plan Note (Signed)
Chronic frequent PVCs followed by Dr. Melburn Popper Cardiology.

## 2023-04-02 ENCOUNTER — Other Ambulatory Visit (INDEPENDENT_AMBULATORY_CARE_PROVIDER_SITE_OTHER): Payer: Medicare Other

## 2023-04-02 DIAGNOSIS — R002 Palpitations: Secondary | ICD-10-CM

## 2023-04-02 DIAGNOSIS — E559 Vitamin D deficiency, unspecified: Secondary | ICD-10-CM | POA: Diagnosis not present

## 2023-04-02 DIAGNOSIS — Z131 Encounter for screening for diabetes mellitus: Secondary | ICD-10-CM | POA: Diagnosis not present

## 2023-04-02 DIAGNOSIS — E039 Hypothyroidism, unspecified: Secondary | ICD-10-CM | POA: Diagnosis not present

## 2023-04-02 DIAGNOSIS — E785 Hyperlipidemia, unspecified: Secondary | ICD-10-CM | POA: Diagnosis not present

## 2023-04-02 LAB — CBC WITH DIFFERENTIAL/PLATELET
Basophils Absolute: 0 10*3/uL (ref 0.0–0.1)
Basophils Relative: 0.6 % (ref 0.0–3.0)
Eosinophils Absolute: 0.2 10*3/uL (ref 0.0–0.7)
Eosinophils Relative: 3.7 % (ref 0.0–5.0)
HCT: 42 % (ref 36.0–46.0)
Hemoglobin: 13.8 g/dL (ref 12.0–15.0)
Lymphocytes Relative: 36.3 % (ref 12.0–46.0)
Lymphs Abs: 1.9 10*3/uL (ref 0.7–4.0)
MCHC: 32.9 g/dL (ref 30.0–36.0)
MCV: 96.6 fl (ref 78.0–100.0)
Monocytes Absolute: 0.5 10*3/uL (ref 0.1–1.0)
Monocytes Relative: 9.7 % (ref 3.0–12.0)
Neutro Abs: 2.6 10*3/uL (ref 1.4–7.7)
Neutrophils Relative %: 49.7 % (ref 43.0–77.0)
Platelets: 233 10*3/uL (ref 150.0–400.0)
RBC: 4.35 Mil/uL (ref 3.87–5.11)
RDW: 13.6 % (ref 11.5–15.5)
WBC: 5.2 10*3/uL (ref 4.0–10.5)

## 2023-04-02 LAB — COMPREHENSIVE METABOLIC PANEL
ALT: 19 U/L (ref 0–35)
AST: 25 U/L (ref 0–37)
Albumin: 4.1 g/dL (ref 3.5–5.2)
Alkaline Phosphatase: 69 U/L (ref 39–117)
BUN: 17 mg/dL (ref 6–23)
CO2: 32 mEq/L (ref 19–32)
Calcium: 10.1 mg/dL (ref 8.4–10.5)
Chloride: 100 mEq/L (ref 96–112)
Creatinine, Ser: 1 mg/dL (ref 0.40–1.20)
GFR: 53.15 mL/min — ABNORMAL LOW (ref >60.00)
Glucose, Bld: 112 mg/dL — ABNORMAL HIGH (ref 70–99)
Potassium: 5.1 mEq/L (ref 3.5–5.1)
Sodium: 138 mEq/L (ref 135–145)
Total Bilirubin: 0.5 mg/dL (ref 0.2–1.2)
Total Protein: 6.6 g/dL (ref 6.0–8.3)

## 2023-04-02 LAB — VITAMIN D 25 HYDROXY (VIT D DEFICIENCY, FRACTURES): VITD: 82.16 ng/mL (ref 30.00–100.00)

## 2023-04-02 LAB — LIPID PANEL
Cholesterol: 154 mg/dL (ref 0–200)
HDL: 54.8 mg/dL (ref >39.00)
LDL Cholesterol: 78 mg/dL (ref 0–99)
NonHDL: 99
Total CHOL/HDL Ratio: 3
Triglycerides: 104 mg/dL (ref 0.0–149.0)
VLDL: 20.8 mg/dL (ref 0.0–40.0)

## 2023-04-02 LAB — T4, FREE: Free T4: 0.86 ng/dL (ref 0.60–1.60)

## 2023-04-02 LAB — TSH: TSH: 2.76 u[IU]/mL (ref 0.35–5.50)

## 2023-04-02 LAB — T3, FREE: T3, Free: 2.5 pg/mL (ref 2.3–4.2)

## 2023-04-02 LAB — HEMOGLOBIN A1C: Hgb A1c MFr Bld: 6 % (ref 4.6–6.5)

## 2023-04-02 NOTE — Progress Notes (Signed)
No critical labs need to be addressed urgently. We will discuss labs in detail at upcoming office visit.   

## 2023-04-09 ENCOUNTER — Encounter: Payer: Self-pay | Admitting: Family Medicine

## 2023-04-09 ENCOUNTER — Ambulatory Visit (INDEPENDENT_AMBULATORY_CARE_PROVIDER_SITE_OTHER): Payer: Medicare Other | Admitting: Family Medicine

## 2023-04-09 VITALS — BP 120/60 | HR 69 | Temp 98.0°F | Ht 63.5 in | Wt 147.4 lb

## 2023-04-09 DIAGNOSIS — Z0000 Encounter for general adult medical examination without abnormal findings: Secondary | ICD-10-CM

## 2023-04-09 DIAGNOSIS — E039 Hypothyroidism, unspecified: Secondary | ICD-10-CM | POA: Diagnosis not present

## 2023-04-09 DIAGNOSIS — E559 Vitamin D deficiency, unspecified: Secondary | ICD-10-CM

## 2023-04-09 DIAGNOSIS — M8580 Other specified disorders of bone density and structure, unspecified site: Secondary | ICD-10-CM

## 2023-04-09 DIAGNOSIS — R7303 Prediabetes: Secondary | ICD-10-CM

## 2023-04-09 DIAGNOSIS — M8588 Other specified disorders of bone density and structure, other site: Secondary | ICD-10-CM

## 2023-04-09 DIAGNOSIS — E785 Hyperlipidemia, unspecified: Secondary | ICD-10-CM

## 2023-04-09 NOTE — Patient Instructions (Addendum)
Decrease carbohydrates diet, keep up with water.  Continue exercise.  Please call the location of your choice from the menu below to schedule your Mammogram and/or Bone Density appointment.    [facility]   Phone:  [phone]   [address]                                                                            [place], [place] [address]                                            Services: 3D Mammogram and Bone [facility] at [facility])  Phone:  [phone]   [address]                        [place], [place] [address]                                              Services:  3D Mammogram and Bone Density

## 2023-04-09 NOTE — Assessment & Plan Note (Signed)
Chronic stable control on levothyroxine 75 mcg daily.

## 2023-04-09 NOTE — Assessment & Plan Note (Signed)
Chronic, LDL at goal on simvastatin 20 mg daily. Encouraged exercise, weight loss, healthy eating habits.

## 2023-04-09 NOTE — Progress Notes (Signed)
Patient ID: Catherine Smith, female    DOB: [birth year], 82 y.o.   MRN: [id number]  This visit was conducted in person.  BP 120/60 (BP Location: Left Arm, Patient Position: Sitting, Cuff Size: Normal)   Pulse 69   Temp 98 F (36.7 C) (Temporal)   Ht 5' 3.5" (1.613 m)   Wt 147 lb 6 oz (66.8 kg)   SpO2 98%   BMI 25.70 kg/m    CC:   Chief Complaint  Patient presents with   Medicare Wellness     Subjective:   HPI: Catherine Smith is a 82 y.o. female presenting on 04/09/2023 for Medicare Wellness The patient presents for annual medicare wellness, complete physical and review of chronic health problems. He/She also has the following acute concerns today: none  I have personally reviewed the Medicare Annual Wellness questionnaire and have noted 1. The patient's medical and social history 2. Their use of alcohol, tobacco or illicit drugs 3. Their current medications and supplements 4. The patient's functional ability including ADL's, fall risks, home safety risks and hearing or visual             impairment. 5. Diet and physical activities 6. Evidence for depression or mood disorders 7.         Updated provider list Cognitive evaluation was performed and recorded on pt medicare questionnaire form. The patients weight, height, BMI and visual acuity have been recorded in the chart   I have made referrals, counseling and provided education to the patient based review of the above and I have provided the pt with a written personalized care plan for preventive services.   Documentation of this information was scanned into the electronic record under the media tab.   Advance directives and end of life planning reviewed in detail with patient and documented in EMR. Patient given handout on advance care directives if needed. HCPOA and living will updated if needed.  No falls in last 12 months.  Screening hearing and vision performed.  Flowsheet Row Office Visit from 03/05/2023 in [facility]  PHQ-2 Total Score 0       Vitamin D deficiency: Within normal range at recent check on 5000 IU daily  Hypothyroidism:  Chronic stable control on levothyroxine 75 mcg daily. 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  Moderately dilated RV . She has elevated RV systolic pressure of 35 mmHg.  Pulmonary HTN    Elevated Cholesterol: Lab Results  Component Value Date   CHOL 154 04/02/2023   HDL 54.80 04/02/2023   LDLCALC 78 04/02/2023   LDLDIRECT 110.6 12/09/2012   TRIG 104.0 04/02/2023   CHOLHDL 3 04/02/2023  Using medications without problems: none Muscle aches:  none Diet compliance: moderate Exercise: water aerobic 2 days a week, walking 2-3 additional days. Other complaints:  On simvastatin 20 mg daily   Relevant past medical, surgical, family and social history reviewed and updated as indicated. Interim medical history since our last visit reviewed. Allergies and medications reviewed and updated. Outpatient Medications Prior to Visit  Medication Sig Dispense Refill   Ascorbic Acid (VITAMIN C) 500 MG CAPS Take by mouth daily.     aspirin 81 MG tablet Take 81 mg by mouth daily.       Biotin 10 MG TABS Take  by mouth daily.     bisoprolol (ZEBETA) 5 MG tablet Take 5 mg by mouth daily.     Calcium Carbonate (CALTRATE 600 PO) Take 1 tablet by mouth daily.     Cholecalciferol (VITAMIN D3) 2000 UNITS TABS Take 1 tablet by mouth daily.     cyanocobalamin (,VITAMIN B-12,) 1000 MCG/ML injection INJECT 1 ML (1,000 MCG TOTAL) INTO THE MUSCLE EVERY 30 DAYS. 10 mL 1   levothyroxine (SYNTHROID) 75 MCG tablet Take 75 mcg by mouth daily. Skips one day a week     MAGNESIUM CITRATE PO Take 500 mg by mouth daily.     Multiple Vitamins-Minerals (MULTIVITAMIN WITH MINERALS) tablet Take 1  tablet by mouth daily.     simvastatin (ZOCOR) 20 MG tablet Take 1 tablet (20 mg total) by mouth at bedtime. 90 tablet 3   calcium carbonate (OSCAL) 1500 (600 Ca) MG TABS tablet Take by mouth daily.     magnesium oxide (MAG-OX) 400 MG tablet Take 500 mg by mouth daily.      No facility-administered medications prior to visit.     Per HPI unless specifically indicated in ROS section below Review of Systems  Constitutional:  Negative for fatigue and fever.  HENT:  Negative for congestion.   Eyes:  Negative for pain.  Respiratory:  Negative for cough and shortness of breath.   Cardiovascular:  Negative for chest pain, palpitations and leg swelling.  Gastrointestinal:  Negative for abdominal pain.  Genitourinary:  Negative for dysuria and vaginal bleeding.  Musculoskeletal:  Negative for back pain.  Neurological:  Negative for syncope, light-headedness and headaches.  Psychiatric/Behavioral:  Negative for dysphoric mood.    Objective:  BP 120/60 (BP Location: Left Arm, Patient Position: Sitting, Cuff Size: Normal)   Pulse 69   Temp 98 F (36.7 C) (Temporal)   Ht 5' 3.5" (1.613 m)   Wt 147 lb 6 oz (66.8 kg)   SpO2 98%   BMI 25.70 kg/m   Wt Readings from Last 3 Encounters:  04/09/23 147 lb 6 oz (66.8 kg)  03/05/23 147 lb 2 oz (66.7 kg)  01/24/23 144 lb (65.3 kg)      Physical Exam Constitutional:      General: She is not in acute distress.    Appearance: Normal appearance. She is well-developed. She is not ill-appearing or toxic-appearing.  HENT:     Head: Normocephalic.     Right Ear: Hearing, tympanic membrane, ear canal and external ear normal. Tympanic membrane is not erythematous, retracted or bulging.     Left Ear: Hearing, tympanic membrane, ear canal and external ear normal. Tympanic membrane is not erythematous, retracted or bulging.     Nose: No mucosal edema or rhinorrhea.     Right Sinus: No maxillary sinus tenderness or frontal sinus tenderness.     Left Sinus:  No maxillary sinus tenderness or frontal sinus tenderness.     Mouth/Throat:     Pharynx: Uvula midline.  Eyes:     General: Lids are normal. Lids are everted, no foreign bodies appreciated.     Conjunctiva/sclera: Conjunctivae normal.     Pupils: Pupils are equal, round, and reactive to light.  Neck:     Thyroid: No thyroid mass or thyromegaly.     Vascular: No carotid bruit.     Trachea: Trachea normal.  Cardiovascular:     Rate and Rhythm: Normal rate and regular rhythm.     Pulses: Normal pulses.     Heart sounds: Normal  heart sounds, S1 normal and S2 normal. No murmur heard.    No friction rub. No gallop.  Pulmonary:     Effort: Pulmonary effort is normal. No tachypnea or respiratory distress.     Breath sounds: Normal breath sounds. No decreased breath sounds, wheezing, rhonchi or rales.  Abdominal:     General: Bowel sounds are normal.     Palpations: Abdomen is soft.     Tenderness: There is no abdominal tenderness.  Musculoskeletal:     Cervical back: Normal range of motion and neck supple.  Skin:    General: Skin is warm and dry.     Findings: No rash.  Neurological:     Mental Status: She is alert.  Psychiatric:        Mood and Affect: Mood is not anxious or depressed.        Speech: Speech normal.        Behavior: Behavior normal. Behavior is cooperative.        Thought Content: Thought content normal.        Judgment: Judgment normal.       Results for orders placed or performed in visit on 04/02/23  CBC with Differential/Platelet  Result Value Ref Range   WBC 5.2 4.0 - 10.5 K/uL   RBC 4.35 3.87 - 5.11 Mil/uL   Hemoglobin 13.8 12.0 - 15.0 g/dL   HCT 16.1 09.6 - 04.5 %   MCV 96.6 78.0 - 100.0 fl   MCHC 32.9 30.0 - 36.0 g/dL   RDW 40.9 81.1 - 91.4 %   Platelets 233.0 150.0 - 400.0 K/uL   Neutrophils Relative % 49.7 43.0 - 77.0 %   Lymphocytes Relative 36.3 12.0 - 46.0 %   Monocytes Relative 9.7 3.0 - 12.0 %   Eosinophils Relative 3.7 0.0 - 5.0 %    Basophils Relative 0.6 0.0 - 3.0 %   Neutro Abs 2.6 1.4 - 7.7 K/uL   Lymphs Abs 1.9 0.7 - 4.0 K/uL   Monocytes Absolute 0.5 0.1 - 1.0 K/uL   Eosinophils Absolute 0.2 0.0 - 0.7 K/uL   Basophils Absolute 0.0 0.0 - 0.1 K/uL  Hemoglobin A1c  Result Value Ref Range   Hgb A1c MFr Bld 6.0 4.6 - 6.5 %  T3, free  Result Value Ref Range   T3, Free 2.5 2.3 - 4.2 pg/mL  T4, free  Result Value Ref Range   Free T4 0.86 0.60 - 1.60 ng/dL  TSH  Result Value Ref Range   TSH 2.76 0.35 - 5.50 uIU/mL  Comprehensive metabolic panel  Result Value Ref Range   Sodium 138 135 - 145 mEq/L   Potassium 5.1 3.5 - 5.1 mEq/L   Chloride 100 96 - 112 mEq/L   CO2 32 19 - 32 mEq/L   Glucose, Bld 112 (H) 70 - 99 mg/dL   BUN 17 6 - 23 mg/dL   Creatinine, Ser 7.82 0.40 - 1.20 mg/dL   Total Bilirubin 0.5 0.2 - 1.2 mg/dL   Alkaline Phosphatase 69 39 - 117 U/L   AST 25 0 - 37 U/L   ALT 19 0 - 35 U/L   Total Protein 6.6 6.0 - 8.3 g/dL   Albumin 4.1 3.5 - 5.2 g/dL   GFR 95.62 (L) >13.08 mL/min   Calcium 10.1 8.4 - 10.5 mg/dL  Lipid panel  Result Value Ref Range   Cholesterol 154 0 - 200 mg/dL   Triglycerides 657.8 0.0 - 149.0 mg/dL   HDL 46.96 >29.52 mg/dL  VLDL 20.8 0.0 - 40.0 mg/dL   LDL Cholesterol 78 0 - 99 mg/dL   Total CHOL/HDL Ratio 3    NonHDL 99.00   VITAMIN D 25 Hydroxy (Vit-D Deficiency, Fractures)  Result Value Ref Range   VITD 82.16 30.00 - 100.00 ng/mL    Assessment and Plan The patient's preventative maintenance and recommended screening tests for an annual wellness exam were reviewed in full today. Brought up to date unless services declined.  Counselled on the importance of diet, exercise, and its role in overall health and mortality. The patient's FH and SH was reviewed, including their home life, tobacco status, and drug and alcohol status.   Vaccines: Up-to-date with tetanus, flu, Pneumovax.  Status post 2 COVID vaccines, can consider Shingrix  ( she thinks she has had it)and RSV  vaccine. Pap/DVE: Not indicated due to age Mammo: 12/2022 normal, yearly Bone Density:  09/17/2018 osteopenia, no significant change from 2017. Colon: October 14, 2018, Dr. Servando Snare,  q5 years as  mother with colon cancer  Smoking Status: None ETOH/ drug use: None/none  Hep C: Completed  HIV screen:    Medicare annual wellness visit, subsequent  Vitamin D deficiency Assessment & Plan: Stable, chronic.  Continue current over-the-counter 5000 international units daily supplement.     Acquired hypothyroidism Assessment & Plan: Chronic stable control on levothyroxine 75 mcg daily.     Hyperlipidemia with target LDL less than 100 Assessment & Plan: Chronic, LDL at goal on simvastatin 20 mg daily. Encouraged exercise, weight loss, healthy eating habits.   Orders: -     Comprehensive metabolic panel; Future -     Lipid panel; Future  Prediabetes -     Hemoglobin A1c; Future  Osteopenia, unspecified location -     DG Bone Density; Future  Other specified disorders of bone density and structure, other site -     DG Bone Density; Future    Return in about 3 months (around 07/10/2023) for  follow up prediabetes with labs prior.   Kerby Nora, MD

## 2023-04-09 NOTE — Assessment & Plan Note (Signed)
Stable, chronic.  Continue current over-the-counter 5000 international units daily supplement.

## 2023-05-08 ENCOUNTER — Ambulatory Visit
Admission: RE | Admit: 2023-05-08 | Discharge: 2023-05-08 | Disposition: A | Discharge status: Home / Self Care | Payer: Medicare Other | Source: Ambulatory Visit | Attending: Family Medicine | Admitting: Family Medicine

## 2023-05-08 DIAGNOSIS — M8580 Other specified disorders of bone density and structure, unspecified site: Secondary | ICD-10-CM | POA: Diagnosis present

## 2023-05-08 DIAGNOSIS — M8588 Other specified disorders of bone density and structure, other site: Secondary | ICD-10-CM | POA: Diagnosis present

## 2023-05-09 ENCOUNTER — Other Ambulatory Visit: Payer: Self-pay | Admitting: Family Medicine

## 2023-05-09 DIAGNOSIS — E785 Hyperlipidemia, unspecified: Secondary | ICD-10-CM

## 2023-05-24 ENCOUNTER — Other Ambulatory Visit: Payer: Self-pay | Admitting: Family Medicine

## 2023-05-27 NOTE — Telephone Encounter (Signed)
Last office visit 04/09/23 for MWV.  Last refilled 09/20/17 for 10 ml with 1 refill.  Last B12 level 06/12/22 which was normal at 614 pg/mL.  Next appt: 07/17/23 for Prediabetes.

## 2023-07-03 ENCOUNTER — Other Ambulatory Visit: Payer: Medicare Other

## 2023-07-08 ENCOUNTER — Ambulatory Visit (INDEPENDENT_AMBULATORY_CARE_PROVIDER_SITE_OTHER): Payer: Medicare Other | Admitting: Family Medicine

## 2023-07-08 ENCOUNTER — Encounter: Payer: Self-pay | Admitting: Family Medicine

## 2023-07-08 VITALS — BP 122/66 | HR 75 | Temp 97.6°F | Ht 63.5 in | Wt 145.0 lb

## 2023-07-08 DIAGNOSIS — U071 COVID-19: Secondary | ICD-10-CM | POA: Diagnosis not present

## 2023-07-08 DIAGNOSIS — R051 Acute cough: Secondary | ICD-10-CM

## 2023-07-08 LAB — POCT INFLUENZA A/B
Influenza A, POC: NEGATIVE
Influenza B, POC: NEGATIVE

## 2023-07-08 LAB — POC COVID19 BINAXNOW: SARS Coronavirus 2 Ag: POSITIVE — AB

## 2023-07-08 MED ORDER — NIRMATRELVIR/RITONAVIR (PAXLOVID)TABLET: ORAL_TABLET | ORAL | 0 refills | Status: DC

## 2023-07-08 NOTE — Assessment & Plan Note (Signed)
COVID19  Infection < 5 days from onset of symptoms in  vaccinated overweight individual with no history of chronic pulmonary disease.  No clear sign of bacterial infection at this time.   No SOB.  No red flags/need for ER visit or in-person exam at respiratory clinic at this time..    Pt higher risk for COVID complications given  age. GFR  53 and no medication contraindications.  Start  renal dose paxlovid 5 day course. Reviewed course of medication and side effect profile with patient in detail.   Symptomatic care with mucinex and cough suppressant at night. If SOB begins symptoms worsening.. have low threshold for in-person exam, if severe shortness of breath ER visit recommended.  Can monitor Oxygen saturation at home with home monitor if able to obtain.  Go to ER if O2 sat < 90% on room air.   Reviewed home care and provided information through MyChart.  Recommended quarantine 5 days isolation recommended. Return to work day 6 and wear mask for 4 more days to complete 10 days. Provided info about prevention of spread of COVID 19.

## 2023-07-08 NOTE — Progress Notes (Signed)
Patient ID: Catherine Smith, female    DOB: 1940/11/17, 82 y.o.   MRN: [id number]  This visit was conducted in person.  BP 122/66   Pulse 75   Temp 97.6 F (36.4 C) (Temporal)   Ht 5' 3.5" (1.613 m)   Wt 145 lb (65.8 kg)   SpO2 96%   BMI 25.28 kg/m    CC:  Chief Complaint  Patient presents with   Cough    Painful productive cough, low grade fever Symptoms started Saturday evening    Subjective:   HPI: Catherine Smith is a 82 y.o. female presenting on 07/08/2023 for Cough (Painful productive cough, low grade fever/Symptoms started Saturday evening)   Date of onset: 2 days Initial symptoms included  cough.. productive dark sputum Symptoms progressed to low grade temp not measured  Some body ache.  No ear pain, no ST, no face pain  Feeling fatigued  No SOB.   No wheeze, no CP.   Sick contacts:  recent trip to [place] COVID testing:   none    She has tried to treat with  dayquil, mucinex.. helped minimally     No history of chronic lung disease such as asthma or COPD. Non-smoker.      Relevant past medical, surgical, family and social history reviewed and updated as indicated. Interim medical history since our last visit reviewed. Allergies and medications reviewed and updated. Outpatient Medications Prior to Visit  Medication Sig Dispense Refill   Ascorbic Acid (VITAMIN C) 500 MG CAPS Take by mouth daily.     aspirin 81 MG tablet Take 81 mg by mouth daily.       Biotin 10 MG TABS Take by mouth daily.     bisoprolol (ZEBETA) 5 MG tablet TAKE ONE TABLET BY MOUTH ONCE A DAY 90 tablet 3   Calcium Carbonate (CALTRATE 600 PO) Take 1 tablet by mouth daily.     Cholecalciferol (VITAMIN D3) 2000 UNITS TABS Take 1 tablet by mouth daily.     cyanocobalamin (VITAMIN B12) 1000 MCG/ML injection INJECT ONE ML INTO THE MUSCLE MONTHLY 3 mL 3   levothyroxine (SYNTHROID) 75 MCG tablet TAKE ONE TABLET BY MOUTH ONCE A DAY. TAKE ON AN EMPTY STOMACH WITH A GLASS OF WATER ATLEAST 30-60 MIN  BEFORE BREAKFAST 90 tablet 3   MAGNESIUM CITRATE PO Take 500 mg by mouth daily.     Multiple Vitamins-Minerals (MULTIVITAMIN WITH MINERALS) tablet Take 1 tablet by mouth daily.     simvastatin (ZOCOR) 20 MG tablet TAKE ONE TABLET BY MOUTH EVERY NIGHT AT BEDTIME 90 tablet 3   No facility-administered medications prior to visit.     Per HPI unless specifically indicated in ROS section below Review of Systems  Constitutional:  Positive for fatigue. Negative for fever.  HENT:  Negative for congestion.   Eyes:  Negative for pain.  Respiratory:  Positive for cough. Negative for shortness of breath.   Cardiovascular:  Negative for chest pain, palpitations and leg swelling.  Gastrointestinal:  Negative for abdominal pain.  Genitourinary:  Negative for dysuria and vaginal bleeding.  Musculoskeletal:  Negative for back pain.  Neurological:  Negative for syncope, light-headedness and headaches.  Psychiatric/Behavioral:  Negative for dysphoric mood.    Objective:  BP 122/66   Pulse 75   Temp 97.6 F (36.4 C) (Temporal)   Ht 5' 3.5" (1.613 m)   Wt 145 lb (65.8 kg)   SpO2 96%   BMI 25.28 kg/m   Wt Readings from Last  3 Encounters:  07/08/23 145 lb (65.8 kg)  04/09/23 147 lb 6 oz (66.8 kg)  03/05/23 147 lb 2 oz (66.7 kg)      Physical Exam Constitutional:      General: She is not in acute distress.    Appearance: She is well-developed. She is not ill-appearing or toxic-appearing.  HENT:     Head: Normocephalic.     Right Ear: Hearing, tympanic membrane, ear canal and external ear normal. Tympanic membrane is not erythematous, retracted or bulging.     Left Ear: Hearing, tympanic membrane, ear canal and external ear normal. Tympanic membrane is not erythematous, retracted or bulging.     Nose: Mucosal edema present.     Right Sinus: No maxillary sinus tenderness or frontal sinus tenderness.     Left Sinus: No maxillary sinus tenderness or frontal sinus tenderness.     Mouth/Throat:      Mouth: Oropharynx is clear and moist and mucous membranes are normal.     Pharynx: Uvula midline.  Eyes:     General: Lids are normal. Lids are everted, no foreign bodies appreciated.     Extraocular Movements: EOM normal.     Conjunctiva/sclera: Conjunctivae normal.     Pupils: Pupils are equal, round, and reactive to light.  Neck:     Thyroid: No thyroid mass or thyromegaly.     Vascular: No carotid bruit.     Trachea: Trachea normal.  Cardiovascular:     Rate and Rhythm: Normal rate and regular rhythm.     Pulses: Normal pulses.     Heart sounds: Normal heart sounds, S1 normal and S2 normal. No murmur heard.    No friction rub. No gallop.  Pulmonary:     Effort: Pulmonary effort is normal. No tachypnea or respiratory distress.     Breath sounds: Normal breath sounds. No decreased breath sounds, wheezing, rhonchi or rales.  Musculoskeletal:     Cervical back: Normal range of motion and neck supple.  Skin:    General: Skin is warm, dry and intact.     Findings: No rash.  Neurological:     Mental Status: She is alert.  Psychiatric:        Mood and Affect: Mood is not anxious or depressed.        Speech: Speech normal.        Behavior: Behavior normal. Behavior is cooperative.        Cognition and Memory: Cognition and memory normal.        Judgment: Judgment normal.       Results for orders placed or performed in visit on 07/08/23  POC COVID-19  Result Value Ref Range   SARS Coronavirus 2 Ag Positive (A) Negative  Influenza A/B  Result Value Ref Range   Influenza A, POC Negative Negative   Influenza B, POC Negative Negative    Assessment and Plan  Acute cough -     POC COVID-19 BinaxNow -     POCT Influenza A/B  COVID-19 Assessment & Plan: COVID19  Infection < 5 days from onset of symptoms in  vaccinated overweight individual with no history of chronic pulmonary disease.  No clear sign of bacterial infection at this time.   No SOB.  No red flags/need for ER  visit or in-person exam at respiratory clinic at this time..    Pt higher risk for COVID complications given  age. GFR  53 and no medication contraindications.  Start  renal dose paxlovid 5  day course. Reviewed course of medication and side effect profile with patient in detail.   Symptomatic care with mucinex and cough suppressant at night. If SOB begins symptoms worsening.. have low threshold for in-person exam, if severe shortness of breath ER visit recommended.  Can monitor Oxygen saturation at home with home monitor if able to obtain.  Go to ER if O2 sat < 90% on room air.   Reviewed home care and provided information through MyChart.  Recommended quarantine 5 days isolation recommended. Return to work day 6 and wear mask for 4 more days to complete 10 days. Provided info about prevention of spread of COVID 19.    Other orders -     nirmatrelvir/ritonavir; Patient GFR is 53. Take nirmatrelvir (150 mg) 1 tablet(s) twice daily for 5 days and ritonavir (100 mg) one tablet twice daily for 5 days.  Dispense: 20 tablet; Refill: 0    No follow-ups on file.   Kerby Nora, MD

## 2023-07-10 ENCOUNTER — Ambulatory Visit: Payer: Medicare Other | Admitting: Family Medicine

## 2023-07-10 ENCOUNTER — Other Ambulatory Visit: Payer: Medicare Other

## 2023-07-17 ENCOUNTER — Ambulatory Visit (INDEPENDENT_AMBULATORY_CARE_PROVIDER_SITE_OTHER): Payer: Medicare Other | Admitting: Family Medicine

## 2023-07-17 VITALS — BP 124/60 | HR 64 | Temp 97.8°F | Ht 63.5 in | Wt 143.0 lb

## 2023-07-17 DIAGNOSIS — R7303 Prediabetes: Secondary | ICD-10-CM

## 2023-07-17 DIAGNOSIS — U071 COVID-19: Secondary | ICD-10-CM

## 2023-07-17 LAB — POCT GLYCOSYLATED HEMOGLOBIN (HGB A1C): Hemoglobin A1C: 5.8 % — AB (ref 4.0–5.6)

## 2023-07-17 NOTE — Assessment & Plan Note (Signed)
Acute, resolving symptoms.  Recommended symptomatic care and consider flu shot for prevention in 2 weeks, COVID-vaccine in 30 to 90 days.

## 2023-07-17 NOTE — Progress Notes (Signed)
Patient ID: Catherine Smith, female    DOB: October 20, 1940, 82 y.o.   MRN: [id number]  This visit was conducted in person.  BP 124/60 (BP Location: Left Arm, Patient Position: Sitting, Cuff Size: Normal)   Pulse 64   Temp 97.8 F (36.6 C) (Oral)   Ht 5' 3.5" (1.613 m)   Wt 143 lb (64.9 kg)   SpO2 97%   BMI 24.93 kg/m    CC:  Chief Complaint  Patient presents with   Diabetes    F/u on DM; supposed to have labs done prior to visit but has not yet.    Subjective:   HPI: Catherine Smith is a 82 y.o. female presenting on 07/17/2023 for Diabetes (F/u on DM; supposed to have labs done prior to visit but has not yet.)   Prediabetes...  new at last check  in 03/2023... she has been working on healthy  low carb eating and  walking daily 30-45 min, weights Lab Results  Component Value Date   HGBA1C 5.8 (A) 07/17/2023      9/23 COVID in last  week... resolved symptoms other than fatigue. Still occ cough.  No fever.     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  Moderately dilated RV . She has elevated RV systolic pressure of 35 mmHg.  Pulmonary HTN       Relevant past medical, surgical, family and social history reviewed and updated as indicated. Interim medical history since our last visit reviewed. Allergies and medications reviewed and updated. Outpatient Medications Prior to Visit  Medication Sig Dispense Refill   Ascorbic Acid (VITAMIN C) 500 MG CAPS Take by mouth daily.     aspirin 81 MG tablet Take 81 mg by mouth daily.       Biotin 10 MG TABS Take by mouth daily.     bisoprolol (ZEBETA) 5 MG tablet TAKE ONE TABLET BY MOUTH ONCE A DAY 90 tablet 3   Calcium Carbonate (CALTRATE 600 PO) Take 1 tablet by mouth daily.     Cholecalciferol (VITAMIN D3) 2000 UNITS TABS Take 1 tablet by mouth daily.     cyanocobalamin (VITAMIN B12) 1000 MCG/ML injection INJECT ONE ML INTO THE MUSCLE MONTHLY  3 mL 3   levothyroxine (SYNTHROID) 75 MCG tablet TAKE ONE TABLET BY MOUTH ONCE A DAY. TAKE ON AN EMPTY STOMACH WITH A GLASS OF WATER ATLEAST 30-60 MIN BEFORE BREAKFAST 90 tablet 3   MAGNESIUM CITRATE PO Take 500 mg by mouth daily.     Multiple Vitamins-Minerals (MULTIVITAMIN WITH MINERALS) tablet Take 1 tablet by mouth daily.     simvastatin (ZOCOR) 20 MG tablet TAKE ONE TABLET BY MOUTH EVERY NIGHT AT BEDTIME 90 tablet 3   nirmatrelvir/ritonavir (PAXLOVID) 20 x 150 MG & 10 x 100MG  TABS Patient GFR is 53. Take nirmatrelvir (150 mg) 1 tablet(s) twice daily for 5 days and ritonavir (100 mg) one tablet twice daily for 5 days. 20 tablet 0   No facility-administered medications prior to visit.     Per HPI unless specifically indicated in ROS section below Review of Systems  Constitutional:  Negative for fatigue and fever.  HENT:  Negative for congestion.   Eyes:  Negative for pain.  Respiratory:  Negative for cough and shortness of breath.   Cardiovascular:  Negative for chest pain, palpitations and leg swelling.  Gastrointestinal:  Negative for abdominal pain.  Genitourinary:  Negative for dysuria and  vaginal bleeding.  Musculoskeletal:  Negative for back pain.  Neurological:  Negative for syncope, light-headedness and headaches.  Psychiatric/Behavioral:  Negative for dysphoric mood.    Objective:  BP 124/60 (BP Location: Left Arm, Patient Position: Sitting, Cuff Size: Normal)   Pulse 64   Temp 97.8 F (36.6 C) (Oral)   Ht 5' 3.5" (1.613 m)   Wt 143 lb (64.9 kg)   SpO2 97%   BMI 24.93 kg/m   Wt Readings from Last 3 Encounters:  07/17/23 143 lb (64.9 kg)  07/08/23 145 lb (65.8 kg)  04/09/23 147 lb 6 oz (66.8 kg)      Physical Exam Constitutional:      General: She is not in acute distress.    Appearance: Normal appearance. She is well-developed. She is not ill-appearing or toxic-appearing.  HENT:     Head: Normocephalic.     Right Ear: Hearing, tympanic membrane, ear canal and  external ear normal. Tympanic membrane is not erythematous, retracted or bulging.     Left Ear: Hearing, tympanic membrane, ear canal and external ear normal. Tympanic membrane is not erythematous, retracted or bulging.     Nose: No mucosal edema or rhinorrhea.     Right Sinus: No maxillary sinus tenderness or frontal sinus tenderness.     Left Sinus: No maxillary sinus tenderness or frontal sinus tenderness.     Mouth/Throat:     Pharynx: Uvula midline.  Eyes:     General: Lids are normal. Lids are everted, no foreign bodies appreciated.     Conjunctiva/sclera: Conjunctivae normal.     Pupils: Pupils are equal, round, and reactive to light.  Neck:     Thyroid: No thyroid mass or thyromegaly.     Vascular: No carotid bruit.     Trachea: Trachea normal.  Cardiovascular:     Rate and Rhythm: Normal rate and regular rhythm.     Pulses: Normal pulses.     Heart sounds: Normal heart sounds, S1 normal and S2 normal. No murmur heard.    No friction rub. No gallop.  Pulmonary:     Effort: Pulmonary effort is normal. No tachypnea or respiratory distress.     Breath sounds: Normal breath sounds. No decreased breath sounds, wheezing, rhonchi or rales.  Abdominal:     General: Bowel sounds are normal.     Palpations: Abdomen is soft.     Tenderness: There is no abdominal tenderness.  Musculoskeletal:     Cervical back: Normal range of motion and neck supple.  Skin:    General: Skin is warm and dry.     Findings: No rash.  Neurological:     Mental Status: She is alert.  Psychiatric:        Mood and Affect: Mood is not anxious or depressed.        Speech: Speech normal.        Behavior: Behavior normal. Behavior is cooperative.        Thought Content: Thought content normal.        Judgment: Judgment normal.       Results for orders placed or performed in visit on 07/17/23  POCT glycosylated hemoglobin (Hb A1C)  Result Value Ref Range   Hemoglobin A1C 5.8 (A) 4.0 - 5.6 %   HbA1c POC  (<> result, manual entry)     HbA1c, POC (prediabetic range)     HbA1c, POC (controlled diabetic range)      Assessment and Plan  Prediabetes Assessment & Plan:  Chronic, improved control with lifestyle change and regular exercise.   Orders: -     POCT glycosylated hemoglobin (Hb A1C)  COVID-19 Assessment & Plan: Acute, resolving symptoms.  Recommended symptomatic care and consider flu shot for prevention in 2 weeks, COVID-vaccine in 30 to 90 days.      Return in about 8 months (around 03/16/2024) for phone AMW,  fasting labs then CPE with me.   Kerby Nora, MD

## 2023-07-17 NOTE — Assessment & Plan Note (Signed)
Chronic, improved control with lifestyle change and regular exercise.

## 2023-10-14 ENCOUNTER — Telehealth: Payer: Self-pay | Admitting: Family Medicine

## 2023-10-14 NOTE — Telephone Encounter (Signed)
Copied from CRM [id number]. Topic: Referral - Request for Referral >> Oct 14, 2023 12:00 PM Almira Coaster wrote: Did the patient discuss referral with their provider in the last year? Yes (If No - schedule appointment) (If Yes - send message)  Appointment offered? No, Dr.Bedsole recommended patient for a colonoscopy  Type of order/referral and detailed reason for visit: Colonoscopy  Preference of office, provider, location: Midge Minium, MD  If referral order, have you been seen by this specialty before? Yes (If Yes, this issue or another issue? When? Where?  Can we respond through MyChart? Yes

## 2024-01-24 ENCOUNTER — Ambulatory Visit (HOSPITAL_COMMUNITY): Payer: Medicare Other | Attending: Cardiology

## 2024-01-24 ENCOUNTER — Encounter: Payer: Self-pay | Admitting: Cardiovascular Disease

## 2024-01-24 DIAGNOSIS — I071 Rheumatic tricuspid insufficiency: Secondary | ICD-10-CM | POA: Insufficient documentation

## 2024-01-24 LAB — ECHOCARDIOGRAM COMPLETE
Area-P 1/2: 3.65 cm2
P 1/2 time: 735 ms
S' Lateral: 2.1 cm

## 2024-02-10 ENCOUNTER — Other Ambulatory Visit: Payer: Self-pay | Admitting: Family Medicine

## 2024-02-10 DIAGNOSIS — Z1231 Encounter for screening mammogram for malignant neoplasm of breast: Secondary | ICD-10-CM

## 2024-02-14 ENCOUNTER — Ambulatory Visit
Admission: RE | Admit: 2024-02-14 | Discharge: 2024-02-14 | Disposition: A | Discharge status: Home / Self Care | Source: Ambulatory Visit | Attending: Family Medicine | Admitting: Family Medicine

## 2024-02-14 DIAGNOSIS — Z1231 Encounter for screening mammogram for malignant neoplasm of breast: Secondary | ICD-10-CM | POA: Diagnosis present

## 2024-02-25 ENCOUNTER — Telehealth: Payer: Self-pay | Admitting: *Deleted

## 2024-02-25 DIAGNOSIS — R002 Palpitations: Secondary | ICD-10-CM

## 2024-02-25 DIAGNOSIS — E785 Hyperlipidemia, unspecified: Secondary | ICD-10-CM

## 2024-02-25 DIAGNOSIS — E559 Vitamin D deficiency, unspecified: Secondary | ICD-10-CM

## 2024-02-25 DIAGNOSIS — Z131 Encounter for screening for diabetes mellitus: Secondary | ICD-10-CM

## 2024-02-25 DIAGNOSIS — E039 Hypothyroidism, unspecified: Secondary | ICD-10-CM

## 2024-02-25 NOTE — Telephone Encounter (Signed)
-----   Message from Gerry Krone sent at 02/25/2024  3:36 PM EDT ----- Regarding: Lab orders for Tue, 5.27.25 Patient is scheduled for CPX labs, please order future labs, Thanks , Anselmo Kings

## 2024-03-10 ENCOUNTER — Other Ambulatory Visit: Payer: Self-pay | Admitting: Family Medicine

## 2024-03-10 ENCOUNTER — Ambulatory Visit: Payer: Self-pay | Admitting: Family Medicine

## 2024-03-10 ENCOUNTER — Other Ambulatory Visit (INDEPENDENT_AMBULATORY_CARE_PROVIDER_SITE_OTHER): Payer: Medicare Other

## 2024-03-10 DIAGNOSIS — E559 Vitamin D deficiency, unspecified: Secondary | ICD-10-CM | POA: Diagnosis not present

## 2024-03-10 DIAGNOSIS — Z131 Encounter for screening for diabetes mellitus: Secondary | ICD-10-CM | POA: Diagnosis not present

## 2024-03-10 DIAGNOSIS — E039 Hypothyroidism, unspecified: Secondary | ICD-10-CM | POA: Diagnosis not present

## 2024-03-10 DIAGNOSIS — E785 Hyperlipidemia, unspecified: Secondary | ICD-10-CM

## 2024-03-10 DIAGNOSIS — R002 Palpitations: Secondary | ICD-10-CM | POA: Diagnosis not present

## 2024-03-10 LAB — CBC WITH DIFFERENTIAL/PLATELET
Basophils Absolute: 0 10*3/uL (ref 0.0–0.1)
Basophils Relative: 0.5 % (ref 0.0–3.0)
Eosinophils Absolute: 0.2 10*3/uL (ref 0.0–0.7)
Eosinophils Relative: 4.3 % (ref 0.0–5.0)
HCT: 42.4 % (ref 36.0–46.0)
Hemoglobin: 14.3 g/dL (ref 12.0–15.0)
Lymphocytes Relative: 29.7 % (ref 12.0–46.0)
Lymphs Abs: 1.7 10*3/uL (ref 0.7–4.0)
MCHC: 33.8 g/dL (ref 30.0–36.0)
MCV: 95.5 fl (ref 78.0–100.0)
Monocytes Absolute: 0.5 10*3/uL (ref 0.1–1.0)
Monocytes Relative: 8.5 % (ref 3.0–12.0)
Neutro Abs: 3.2 10*3/uL (ref 1.4–7.7)
Neutrophils Relative %: 57 % (ref 43.0–77.0)
Platelets: 229 10*3/uL (ref 150.0–400.0)
RBC: 4.44 Mil/uL (ref 3.87–5.11)
RDW: 14 % (ref 11.5–15.5)
WBC: 5.6 10*3/uL (ref 4.0–10.5)

## 2024-03-10 LAB — COMPREHENSIVE METABOLIC PANEL WITH GFR
ALT: 22 U/L (ref 0–35)
AST: 27 U/L (ref 0–37)
Albumin: 4.4 g/dL (ref 3.5–5.2)
Alkaline Phosphatase: 62 U/L (ref 39–117)
BUN: 14 mg/dL (ref 6–23)
CO2: 31 meq/L (ref 19–32)
Calcium: 10.4 mg/dL (ref 8.4–10.5)
Chloride: 102 meq/L (ref 96–112)
Creatinine, Ser: 1.02 mg/dL (ref 0.40–1.20)
GFR: 51.57 mL/min — ABNORMAL LOW (ref >60.00)
Glucose, Bld: 129 mg/dL — ABNORMAL HIGH (ref 70–99)
Potassium: 4.9 meq/L (ref 3.5–5.1)
Sodium: 141 meq/L (ref 135–145)
Total Bilirubin: 0.7 mg/dL (ref 0.2–1.2)
Total Protein: 6.6 g/dL (ref 6.0–8.3)

## 2024-03-10 LAB — LIPID PANEL
Cholesterol: 173 mg/dL (ref 0–200)
HDL: 61 mg/dL (ref >39.00)
LDL Cholesterol: 93 mg/dL (ref 0–99)
NonHDL: 112.16
Total CHOL/HDL Ratio: 3
Triglycerides: 98 mg/dL (ref 0.0–149.0)
VLDL: 19.6 mg/dL (ref 0.0–40.0)

## 2024-03-10 LAB — T4, FREE: Free T4: 2.51 ng/dL — ABNORMAL HIGH (ref 0.60–1.60)

## 2024-03-10 LAB — T3, FREE: T3, Free: 9.6 pg/mL — ABNORMAL HIGH (ref 2.3–4.2)

## 2024-03-10 LAB — HEMOGLOBIN A1C: Hgb A1c MFr Bld: 6.2 % (ref 4.6–6.5)

## 2024-03-10 LAB — TSH: TSH: 6.73 u[IU]/mL — ABNORMAL HIGH (ref 0.35–5.50)

## 2024-03-10 LAB — VITAMIN D 25 HYDROXY (VIT D DEFICIENCY, FRACTURES): VITD: 70.97 ng/mL (ref 30.00–100.00)

## 2024-03-10 MED ORDER — LEVOTHYROXINE SODIUM 50 MCG PO TABS: 50.0000 ug | ORAL_TABLET | Freq: Every day | ORAL | 0 refills | Status: DC

## 2024-03-17 ENCOUNTER — Encounter: Payer: Medicare Other | Admitting: Family Medicine

## 2024-03-18 ENCOUNTER — Encounter: Payer: Self-pay | Admitting: Family Medicine

## 2024-03-18 ENCOUNTER — Ambulatory Visit (INDEPENDENT_AMBULATORY_CARE_PROVIDER_SITE_OTHER): Admitting: Family Medicine

## 2024-03-18 VITALS — BP 122/72 | HR 58 | Temp 99.4°F | Ht 64.25 in | Wt 147.8 lb

## 2024-03-18 DIAGNOSIS — Z0000 Encounter for general adult medical examination without abnormal findings: Secondary | ICD-10-CM | POA: Diagnosis not present

## 2024-03-18 DIAGNOSIS — Z800 Family history of malignant neoplasm of digestive organs: Secondary | ICD-10-CM

## 2024-03-18 DIAGNOSIS — E039 Hypothyroidism, unspecified: Secondary | ICD-10-CM

## 2024-03-18 DIAGNOSIS — E559 Vitamin D deficiency, unspecified: Secondary | ICD-10-CM

## 2024-03-18 DIAGNOSIS — E785 Hyperlipidemia, unspecified: Secondary | ICD-10-CM | POA: Diagnosis not present

## 2024-03-18 DIAGNOSIS — M8580 Other specified disorders of bone density and structure, unspecified site: Secondary | ICD-10-CM

## 2024-03-18 DIAGNOSIS — G4700 Insomnia, unspecified: Secondary | ICD-10-CM

## 2024-03-18 DIAGNOSIS — L723 Sebaceous cyst: Secondary | ICD-10-CM

## 2024-03-18 DIAGNOSIS — R7303 Prediabetes: Secondary | ICD-10-CM

## 2024-03-18 NOTE — Assessment & Plan Note (Signed)
Stable, chronic.  Continue current over-the-counter 5000 international units daily supplement.

## 2024-03-18 NOTE — Assessment & Plan Note (Signed)
 Very typical mobile on worrisome lesion on right forearm, present for years.  If it increases in size or becomes painful we will plan referral to dermatology but at the moment we will continue to follow.

## 2024-03-18 NOTE — Assessment & Plan Note (Signed)
 Acute, intermittent.  Reviewed healthy sleep hygiene, recommended trial of melatonin but she does not want to take medication. Encouraged her to not nap during the day and to drink most of her water earlier in the day. The issues with sleep could be related to her elevated free T3 and free T4 levels so may improve with lower dose of levothyroxine .

## 2024-03-18 NOTE — Progress Notes (Signed)
 Patient ID: Catherine Smith, female    DOB: 01-16-1941, 83 y.o.   MRN: [id number]  This visit was conducted in person.  BP 122/72   Pulse (!) 58   Temp 99.4 F (37.4 C) (Oral)   Ht 5' 4.25" (1.632 m)   Wt 147 lb 12.8 oz (67 kg)   SpO2 98%   BMI 25.17 kg/m    CC:   Chief Complaint  Patient presents with   Annual Exam   Mass    Located on right arm   Insomnia    Once she wakes up she can not go back to sleep     Subjective:   HPI: Catherine Smith is a 83 y.o. female presenting on 03/18/2024 for Annual Exam, Mass (Located on right arm), and Insomnia (Once she wakes up she can not go back to sleep) The patient presents for annual medicare wellness, complete physical and review of chronic health problems. He/She also has the following acute concerns today:  1.Skin lesion  right arm.. present for several years... bruises more recently. No pain, no change in size.  No personal or family history of skin cancer. 2. Intermittent Insomnia... last night slept 7 hours but sometime waking after 4 hours and cannot go back to bed.  Not urination related.  Nml mood.   I have personally reviewed the Medicare Annual Wellness questionnaire and have noted 1. The patient's medical and social history 2. Their use of alcohol, tobacco or illicit drugs 3. Their current medications and supplements 4. The patient's functional ability including ADL's, fall risks, home safety risks and hearing or visual             impairment. 5. Diet and physical activities 6. Evidence for depression or mood disorders 7.         Updated provider list Cognitive evaluation was performed and recorded on pt medicare questionnaire form. The patients weight, height, BMI and visual acuity have been recorded in the chart   I have made referrals, counseling and provided education to the patient based review of the above and I have provided the pt with a written personalized care plan for preventive services.   Documentation of this  information was scanned into the electronic record under the media tab.   Advance directives and end of life planning reviewed in detail with patient and documented in EMR. Patient given handout on advance care directives if needed. HCPOA and living will updated if needed.  No falls in last 12 months.  Screening hearing and vision performed.  Flowsheet Row Office Visit from 07/17/2023 in [facility]  PHQ-2 Total Score 0       Vitamin D  deficiency: Within normal range at recent check on 5000 IU daily  Hypothyroidism:   recent TSH as well as free t3 and free t4 elevated... asked pt to drop levo 75 mcg daily to 50 mcg... will re-eval in 4-6 weeks with labs recheck. Lab Results  Component Value Date   TSH 6.73 (H) 03/10/2024    Paroxysmal PVC: Dr Floria Hurst  Last OV reviewed  .Aaron Aas Had ECHO   Mild mitral regurgitation, mild aortic insufficiency,  moderate severe tricuspid regurgitation.  Moderately dilated RV . She has elevated RV systolic pressure of 35 mmHg.  Pulmonary HTN    Elevated Cholesterol: LDL at goal on simvastatin  20 mg daily. Lab Results  Component Value Date   CHOL 173 03/10/2024   HDL 61.00 03/10/2024   LDLCALC 93  03/10/2024   LDLDIRECT 110.6 12/09/2012   TRIG 98.0 03/10/2024   CHOLHDL 3 03/10/2024  Using medications without problems: none Muscle aches:  none Diet compliance: moderate Exercise: water aerobic 2 days a week, walking 2-3 additional days. Other complaints:   Likely worsened per pt given visiting sister.. eating out and more sweet. Lab Results  Component Value Date   HGBA1C 6.2 03/10/2024     Relevant past medical, surgical, family and social history reviewed and updated as indicated. Interim medical history since our last visit reviewed. Allergies and medications reviewed and updated. Outpatient Medications Prior to Visit  Medication Sig Dispense Refill   Ascorbic Acid (VITAMIN C) 500 MG CAPS Take by mouth daily.      aspirin 81 MG tablet Take 81 mg by mouth daily.       Biotin 10 MG TABS Take by mouth daily.     bisoprolol (ZEBETA) 5 MG tablet TAKE ONE TABLET BY MOUTH ONCE A DAY 90 tablet 3   Calcium  Carbonate (CALTRATE 600 PO) Take 1 tablet by mouth daily.     Cholecalciferol (VITAMIN D3) 2000 UNITS TABS Take 1 tablet by mouth daily.     cyanocobalamin  (VITAMIN B12) 1000 MCG/ML injection INJECT ONE ML INTO THE MUSCLE MONTHLY 3 mL 3   levothyroxine  (SYNTHROID ) 50 MCG tablet Take 1 tablet (50 mcg total) by mouth daily. 90 tablet 0   Magnesium Glycinate 120 MG CAPS Take 120 mg by mouth once.     Multiple Vitamins-Minerals (MULTIVITAMIN WITH MINERALS) tablet Take 1 tablet by mouth daily.     simvastatin  (ZOCOR ) 20 MG tablet TAKE ONE TABLET BY MOUTH EVERY NIGHT AT BEDTIME 90 tablet 3   MAGNESIUM CITRATE PO Take 500 mg by mouth daily.     No facility-administered medications prior to visit.     Per HPI unless specifically indicated in ROS section below Review of Systems  Constitutional:  Negative for fatigue and fever.  HENT:  Negative for congestion.   Eyes:  Negative for pain.  Respiratory:  Negative for cough and shortness of breath.   Cardiovascular:  Negative for chest pain, palpitations and leg swelling.  Gastrointestinal:  Negative for abdominal pain.  Genitourinary:  Negative for dysuria and vaginal bleeding.  Musculoskeletal:  Negative for back pain.  Neurological:  Negative for syncope, light-headedness and headaches.  Psychiatric/Behavioral:  Negative for dysphoric mood.    Objective:  BP 122/72   Pulse (!) 58   Temp 99.4 F (37.4 C) (Oral)   Ht 5' 4.25" (1.632 m)   Wt 147 lb 12.8 oz (67 kg)   SpO2 98%   BMI 25.17 kg/m   Wt Readings from Last 3 Encounters:  03/18/24 147 lb 12.8 oz (67 kg)  07/17/23 143 lb (64.9 kg)  07/08/23 145 lb (65.8 kg)      Physical Exam Constitutional:      General: She is not in acute distress.    Appearance: Normal appearance. She is  well-developed. She is not ill-appearing or toxic-appearing.  HENT:     Head: Normocephalic.     Right Ear: Hearing, tympanic membrane, ear canal and external ear normal. Tympanic membrane is not erythematous, retracted or bulging.     Left Ear: Hearing, tympanic membrane, ear canal and external ear normal. Tympanic membrane is not erythematous, retracted or bulging.     Nose: No mucosal edema or rhinorrhea.     Right Sinus: No maxillary sinus tenderness or frontal sinus tenderness.     Left Sinus:  No maxillary sinus tenderness or frontal sinus tenderness.     Mouth/Throat:     Pharynx: Uvula midline.  Eyes:     General: Lids are normal. Lids are everted, no foreign bodies appreciated.     Conjunctiva/sclera: Conjunctivae normal.     Pupils: Pupils are equal, round, and reactive to light.  Neck:     Thyroid : No thyroid  mass or thyromegaly.     Vascular: No carotid bruit.     Trachea: Trachea normal.  Cardiovascular:     Rate and Rhythm: Normal rate and regular rhythm.     Pulses: Normal pulses.     Heart sounds: Normal heart sounds, S1 normal and S2 normal. No murmur heard.    No friction rub. No gallop.  Pulmonary:     Effort: Pulmonary effort is normal. No tachypnea or respiratory distress.     Breath sounds: Normal breath sounds. No decreased breath sounds, wheezing, rhonchi or rales.  Abdominal:     General: Bowel sounds are normal.     Palpations: Abdomen is soft.     Tenderness: There is no abdominal tenderness.  Musculoskeletal:     Cervical back: Normal range of motion and neck supple.  Skin:    General: Skin is warm and dry.     Findings: No rash.  Neurological:     Mental Status: She is alert.  Psychiatric:        Mood and Affect: Mood is not anxious or depressed.        Speech: Speech normal.        Behavior: Behavior normal. Behavior is cooperative.        Thought Content: Thought content normal.        Judgment: Judgment normal.       Results for orders  placed or performed in visit on 03/10/24  CBC with Differential/Platelet   Collection Time: 03/10/24  8:05 AM  Result Value Ref Range   WBC 5.6 4.0 - 10.5 K/uL   RBC 4.44 3.87 - 5.11 Mil/uL   Hemoglobin 14.3 12.0 - 15.0 g/dL   HCT 16.1 09.6 - 04.5 %   MCV 95.5 78.0 - 100.0 fl   MCHC 33.8 30.0 - 36.0 g/dL   RDW 40.9 81.1 - 91.4 %   Platelets 229.0 150.0 - 400.0 K/uL   Neutrophils Relative % 57.0 43.0 - 77.0 %   Lymphocytes Relative 29.7 12.0 - 46.0 %   Monocytes Relative 8.5 3.0 - 12.0 %   Eosinophils Relative 4.3 0.0 - 5.0 %   Basophils Relative 0.5 0.0 - 3.0 %   Neutro Abs 3.2 1.4 - 7.7 K/uL   Lymphs Abs 1.7 0.7 - 4.0 K/uL   Monocytes Absolute 0.5 0.1 - 1.0 K/uL   Eosinophils Absolute 0.2 0.0 - 0.7 K/uL   Basophils Absolute 0.0 0.0 - 0.1 K/uL  VITAMIN D  25 Hydroxy (Vit-D Deficiency, Fractures)   Collection Time: 03/10/24  8:05 AM  Result Value Ref Range   VITD 70.97 30.00 - 100.00 ng/mL  Lipid panel   Collection Time: 03/10/24  8:05 AM  Result Value Ref Range   Cholesterol 173 0 - 200 mg/dL   Triglycerides 78.2 0.0 - 149.0 mg/dL   HDL 95.62 >13.08 mg/dL   VLDL 65.7 0.0 - 84.6 mg/dL   LDL Cholesterol 93 0 - 99 mg/dL   Total CHOL/HDL Ratio 3    NonHDL 112.16   Comprehensive metabolic panel   Collection Time: 03/10/24  8:05 AM  Result Value Ref  Range   Sodium 141 135 - 145 mEq/L   Potassium 4.9 3.5 - 5.1 mEq/L   Chloride 102 96 - 112 mEq/L   CO2 31 19 - 32 mEq/L   Glucose, Bld 129 (H) 70 - 99 mg/dL   BUN 14 6 - 23 mg/dL   Creatinine, Ser 1.61 0.40 - 1.20 mg/dL   Total Bilirubin 0.7 0.2 - 1.2 mg/dL   Alkaline Phosphatase 62 39 - 117 U/L   AST 27 0 - 37 U/L   ALT 22 0 - 35 U/L   Total Protein 6.6 6.0 - 8.3 g/dL   Albumin 4.4 3.5 - 5.2 g/dL   GFR 09.60 (L) >45.40 mL/min   Calcium  10.4 8.4 - 10.5 mg/dL  TSH   Collection Time: 03/10/24  8:05 AM  Result Value Ref Range   TSH 6.73 (H) 0.35 - 5.50 uIU/mL  T4, free   Collection Time: 03/10/24  8:05 AM  Result Value  Ref Range   Free T4 2.51 (H) 0.60 - 1.60 ng/dL  T3, free   Collection Time: 03/10/24  8:05 AM  Result Value Ref Range   T3, Free 9.6 (H) 2.3 - 4.2 pg/mL  Hemoglobin A1c   Collection Time: 03/10/24  8:05 AM  Result Value Ref Range   Hgb A1c MFr Bld 6.2 4.6 - 6.5 %    Assessment and Plan The patient's preventative maintenance and recommended screening tests for an annual wellness exam were reviewed in full today. Brought up to date unless services declined.  Counselled on the importance of diet, exercise, and its role in overall health and mortality. The patient's FH and SH was reviewed, including their home life, tobacco status, and drug and alcohol status.   Vaccines: Up-to-date with tetanus, flu, Pneumovax.  Status post 2 COVID vaccines, can consider Shingrix  ( she thinks she has had it)and RSV vaccine. Pap/DVE: Not indicated due to age Mammo: 12/2022 normal, yearly Bone Density:  04/2023 osteopenia, no significant change from previous.. Colon: October 14, 2018, Dr. Ole Berkeley,  q5 years as  mother with colon cancer, but given advanced age will leave  decision to proceed up to GI.  Smoking Status: None ETOH/ drug use: None/none  Hep C: Completed  HIV screen:    Medicare annual wellness visit, subsequent  Vitamin D  deficiency Assessment & Plan: Stable, chronic.  Continue current over-the-counter 5000 international units daily supplement.     Acquired hypothyroidism Assessment & Plan: Chronic.. recnet change in markers.. TSH elevated as well as elevated free t3 and free t4. Pt has decreased levo to 50 mcg daily. Recheck thyroid  labs on lower dose of levo at 50 mcg daily in 4-6 weeks.   Hyperlipidemia with target LDL less than 100 Assessment & Plan: Chronic, LDL at goal on simvastatin  20 mg daily. Encouraged exercise, weight loss, healthy eating habits.    Prediabetes Assessment & Plan: Chronic, improved control with lifestyle change and regular  exercise.    Osteopenia, unspecified location Assessment & Plan: Recommend weight bearing exercise, calcium  in diet and vit D supplement 400 IU 1-2 times daily.    Acute insomnia Assessment & Plan: Acute, intermittent.  Reviewed healthy sleep hygiene, recommended trial of melatonin but she does not want to take medication. Encouraged her to not nap during the day and to drink most of her water earlier in the day. The issues with sleep could be related to her elevated free T3 and free T4 levels so may improve with lower dose of levothyroxine .  Family history of colon cancer in mother -     Ambulatory referral to Gastroenterology  Sebaceous cyst Assessment & Plan: Very typical mobile on worrisome lesion on right forearm, present for years.  If it increases in size or becomes painful we will plan referral to dermatology but at the moment we will continue to follow.      Return in about 6 weeks (around 04/29/2024) for lab only, and follow up in 6  months with fasting labs prior including A1C, CMET.   Herby Lolling, MD

## 2024-03-18 NOTE — Assessment & Plan Note (Signed)
 Chronic.. recnet change in markers.. TSH elevated as well as elevated free t3 and free t4. Pt has decreased levo to 50 mcg daily. Recheck thyroid  labs on lower dose of levo at 50 mcg daily in 4-6 weeks.

## 2024-03-18 NOTE — Assessment & Plan Note (Signed)
Chronic, improved control with lifestyle change and regular exercise.

## 2024-03-18 NOTE — Assessment & Plan Note (Signed)
Chronic, LDL at goal on simvastatin 20 mg daily. Encouraged exercise, weight loss, healthy eating habits.

## 2024-03-18 NOTE — Assessment & Plan Note (Signed)
 Recommend weight bearing exercise, calcium in diet and vit D supplement 400 IU 1-2 times daily.

## 2024-03-18 NOTE — Patient Instructions (Signed)
 Recheck thyroid  labs on lower dose of levo at 50 mcg daily in 4-6 weeks.

## 2024-04-07 ENCOUNTER — Telehealth: Payer: Self-pay | Admitting: *Deleted

## 2024-04-07 DIAGNOSIS — E039 Hypothyroidism, unspecified: Secondary | ICD-10-CM

## 2024-04-07 NOTE — Telephone Encounter (Signed)
-----   Message from Rocky FORBES Chock sent at 04/07/2024  2:18 PM EDT ----- Regarding: Labs for Wednesday 7.16.25 Please put lab orders in future. Thank you, Rocky

## 2024-04-09 ENCOUNTER — Encounter: Payer: Self-pay | Admitting: Physician Assistant

## 2024-04-26 NOTE — Progress Notes (Unsigned)
 Ellouise Console, PA-C [address] [place], [place]  [address] Phone: [phone]   Gastroenterology Consultation  Referring Provider:     Avelina Greig BRAVO, MD Primary Care Physician:  Avelina Greig BRAVO, MD Primary Gastroenterologist:  Ellouise Console, PA-C / Glendia Holt, MD  Reason for Consultation:     Discuss colonoscopy        HPI:   Catherine Smith is a 83 y.o. y/o female referred for consultation & management  by Avelina Greig BRAVO, MD. She is here today with her daughter.  Here to discuss repeat colonoscopy.  She has family history of colon cancer: Mother (Age 109).  GI symptoms:  None.  Patient denies abdominal pain, bowel irregularities, rectal bleeding, or any other GI symptoms.  Patient's brother had complications of colon perforation after colonoscopy in his 59s.  Patient is reluctant to schedule another colonoscopy.  03/10/2024 labs: Normal CBC, Hgb 14.3.  09/2018 last colonoscopy: By Dr. Jinny: 1 small 6 mm tubular adenoma polyp removed from sigmoid colon with no dysplasia.  Excellent prep.  Pandiverticulosis.  Internal hemorrhoids.  5-year repeat due to family history of colon cancer.  01/2013 colonoscopy: Normal.  12/2008 colonoscopy: Normal.  PMH: Hyperlipidemia, hypothyroidism.  Cardiologist Dr. Lonni.  Echo 01/2024 LVEF 65 - 70%, Moderate Tricuspid valve regurg.  Mild Aortic valve regurge.  Past Medical History:  Diagnosis Date   Bimalleolar ankle fracture 02/17/2011   surgery Dr. Gaylord and Jakie, [facility]   Hx of colonic polyps    Hyperlipidemia    Hypothyroidism    Pap smear for cervical cancer screening 2010   normal   Screening for breast cancer 2011   normal   Thyroid  disease    Tricuspid valve insufficiency     Past Surgical History:  Procedure Laterality Date   BACK SURGERY  07/15/2012   Herniated Disc L5, Sliver of bone protruding into sciatic nerve   bilateral ankle fusion     BREAST CYST EXCISION Right 1970   benign    BUNIONECTOMY     CATARACT EXTRACTION W/PHACO Left 04/27/2020   Procedure: CATARACT EXTRACTION PHACO AND INTRAOCULAR LENS PLACEMENT (IOC) LEFT 5.65  01:03.1  9.0%;  Surgeon: Mittie Gaskin, MD;  Location: [facility];  Service: Ophthalmology;  Laterality: Left;   CATARACT EXTRACTION W/PHACO Right 06/01/2020   Procedure: CATARACT EXTRACTION PHACO AND INTRAOCULAR LENS PLACEMENT (IOC) RIGHT;  Surgeon: Mittie Gaskin, MD;  Location: [facility];  Service: Ophthalmology;  Laterality: Right;  5.46 0:53.9 10.2%   COLONOSCOPY     COLONOSCOPY WITH PROPOFOL  N/A 10/14/2018   Procedure: COLONOSCOPY WITH PROPOFOL ;  Surgeon: Jinny Carmine, MD;  Location: [facility];  Service: Endoscopy;  Laterality: N/A;   ESOPHAGOGASTRODUODENOSCOPY     FRACTURE SURGERY  02/2011?   HERNIA REPAIR  06/14/2022   SPINE SURGERY  1994   cervical disk 3through 6, [facility]   TRIGGER FINGER RELEASE  1984   left  hand   VAGINAL HYSTERECTOMY  1976    Prior to Admission medications   Medication Sig Start Date End Date Taking? Authorizing Provider  Ascorbic Acid (VITAMIN C) 500 MG CAPS Take by mouth daily.    [provider]  aspirin 81 MG tablet Take 81 mg by mouth daily.      [provider]  Biotin 10 MG TABS Take by mouth daily.    [provider]  bisoprolol (ZEBETA) 5 MG tablet TAKE ONE TABLET BY MOUTH ONCE A DAY 05/27/23  Bedsole, Amy E, MD  Calcium  Carbonate (CALTRATE 600 PO) Take 1 tablet by mouth daily.    [provider]  Cholecalciferol (VITAMIN D3) 2000 UNITS TABS Take 1 tablet by mouth daily.    [provider]  cyanocobalamin  (VITAMIN B12) 1000 MCG/ML injection INJECT ONE ML INTO THE MUSCLE MONTHLY 05/28/23   Bedsole, Amy E, MD  levothyroxine  (SYNTHROID ) 50 MCG tablet Take 1 tablet (50 mcg total) by mouth daily. 03/10/24   Bedsole, Amy E, MD  Magnesium Glycinate 120 MG CAPS Take 120 mg by mouth once.    [provider]  Multiple  Vitamins-Minerals (MULTIVITAMIN WITH MINERALS) tablet Take 1 tablet by mouth daily.    [provider]  simvastatin  (ZOCOR ) 20 MG tablet TAKE ONE TABLET BY MOUTH EVERY NIGHT AT BEDTIME 05/09/23   Bedsole, Amy E, MD    Family History  Problem Relation Age of Onset   Colon cancer Mother    Cancer Mother 34       colon Ca    Heart disease Mother    Stroke Mother 63       died after CVA   Heart disease Father 59       AMI, died at 50 of heart failure   COPD Sister    Stroke Brother    COPD Brother    Heart disease Brother    Diabetes Paternal Grandfather    Breast cancer Neg Hx    Colon polyps Neg Hx    Esophageal cancer Neg Hx    Pancreatic cancer Neg Hx    Stomach cancer Neg Hx      Social History   Tobacco Use   Smoking status: Never   Smokeless tobacco: Never  Vaping Use   Vaping status: Never Used  Substance Use Topics   Alcohol use: No   Drug use: No    Allergies as of 04/27/2024 - Review Complete 04/27/2024  Allergen Reaction Noted   Adhesive [tape] Itching 04/20/2020   Latex Itching and Rash 11/13/2021    Review of Systems:    All systems reviewed and negative except where noted in HPI.   Physical Exam:  BP 132/68   Pulse 62   Ht 5' 4.25 (1.632 m)   Wt 148 lb 2 oz (67.2 kg)   SpO2 96%   BMI 25.23 kg/m  No LMP recorded. Patient has had a hysterectomy.  General:   Alert,  Well-developed, well-nourished, pleasant and cooperative in NAD Lungs:  Respirations even and unlabored.  Clear throughout to auscultation.   No wheezes, crackles, or rhonchi. No acute distress. Heart:  Regular rate and rhythm; no murmurs, clicks, rubs, or gallops. Abdomen:  Normal bowel sounds.  No bruits.  Soft, and non-distended without masses, hepatosplenomegaly or hernias noted.  No Tenderness.  No guarding or rebound tenderness.    Neurologic:  Alert and oriented x3;  grossly normal neurologically. Psych:  Alert and cooperative. Normal mood and affect.  Imaging  Studies: No results found.  Labs: CBC    Component Value Date/Time   WBC 5.6 03/10/2024 0805   RBC 4.44 03/10/2024 0805   HGB 14.3 03/10/2024 0805   HCT 42.4 03/10/2024 0805   PLT 229.0 03/10/2024 0805   MCV 95.5 03/10/2024 0805   MCHC 33.8 03/10/2024 0805   RDW 14.0 03/10/2024 0805   LYMPHSABS 1.7 03/10/2024 0805   MONOABS 0.5 03/10/2024 0805   EOSABS 0.2 03/10/2024 0805   BASOSABS 0.0 03/10/2024 0805    CMP  Component Value Date/Time   NA 141 03/10/2024 0805   K 4.9 03/10/2024 0805   CL 102 03/10/2024 0805   CO2 31 03/10/2024 0805   GLUCOSE 129 (H) 03/10/2024 0805   BUN 14 03/10/2024 0805   CREATININE 1.02 03/10/2024 0805   CREATININE 0.98 03/17/2014 0819   CALCIUM  10.4 03/10/2024 0805   PROT 6.6 03/10/2024 0805   ALBUMIN 4.4 03/10/2024 0805   AST 27 03/10/2024 0805   ALT 22 03/10/2024 0805   ALKPHOS 62 03/10/2024 0805   BILITOT 0.7 03/10/2024 0805   GFRNONAA 55 (L) 08/11/2012 0917   GFRAA 64 08/11/2012 0917    Assessment and Plan:   Caiya Bettes is a 83 y.o. y/o female has been referred to discuss scheduling repeat colonoscopy.    1.  Family history of colon cancer: Mother age 41  2.  History of 1 small adenomatous colon polyp removed 09/2018.  Colonoscopies in 2014 and 2010 showed no polyps.  We discussed risks and benefits of repeat surveillance colonoscopy at length.  Patient's brother had complications of colon perforation after colonoscopy in his 43s.  Patient declines to schedule any further repeat colonoscopies.  I agree with her decision.  Risks of colonoscopy outweigh the benefits for her, based on age.  Repeat colonoscopies are typically not recommended after age 87 or 55, due to increased risk of procedure including colon perforation, bleeding, and risk of anesthesia.  We are happy to follow-up if she develops any GI symptoms in the future.  Patient and her daughter are in agreement.  Follow up as needed if she develops any GI symptoms.  Ellouise Console, PA-C

## 2024-04-27 ENCOUNTER — Ambulatory Visit (INDEPENDENT_AMBULATORY_CARE_PROVIDER_SITE_OTHER): Admitting: Physician Assistant

## 2024-04-27 ENCOUNTER — Encounter: Payer: Self-pay | Admitting: Physician Assistant

## 2024-04-27 VITALS — BP 132/68 | HR 62 | Ht 64.25 in | Wt 148.1 lb

## 2024-04-27 DIAGNOSIS — Z1211 Encounter for screening for malignant neoplasm of colon: Secondary | ICD-10-CM

## 2024-04-27 DIAGNOSIS — Z01818 Encounter for other preprocedural examination: Secondary | ICD-10-CM

## 2024-04-27 DIAGNOSIS — Z800 Family history of malignant neoplasm of digestive organs: Secondary | ICD-10-CM

## 2024-04-27 DIAGNOSIS — Z860101 Personal history of adenomatous and serrated colon polyps: Secondary | ICD-10-CM

## 2024-04-27 NOTE — Patient Instructions (Signed)
 Please follow up sooner if symptoms increase or worsen  Due to recent changes in healthcare laws, you may see the results of your imaging and laboratory studies on MyChart before your provider has had a chance to review them.  We understand that in some cases there may be results that are confusing or concerning to you. Not all laboratory results come back in the same time frame and the provider may be waiting for multiple results in order to interpret others.  Please give us  48 hours in order for your provider to thoroughly review all the results before contacting the office for clarification of your results.   Thank you for trusting me with your gastrointestinal care!   Ellouise Console, PA-C _______________________________________________________  If your blood pressure at your visit was 140/90 or greater, please contact your primary care physician to follow up on this.  _______________________________________________________  If you are age 82 or older, your body mass index should be between 23-30. Your Body mass index is 25.23 kg/m. If this is out of the aforementioned range listed, please consider follow up with your Primary Care Provider.  If you are age 82 or younger, your body mass index should be between 19-25. Your Body mass index is 25.23 kg/m. If this is out of the aformentioned range listed, please consider follow up with your Primary Care Provider.   ________________________________________________________  The  GI providers would like to encourage you to use MYCHART to communicate with providers for non-urgent requests or questions.  Due to long hold times on the telephone, sending your provider a message by [facility] may be a faster and more efficient way to get a response.  Please allow 48 business hours for a response.  Please remember that this is for non-urgent requests.  _______________________________________________________

## 2024-04-29 ENCOUNTER — Ambulatory Visit: Payer: Self-pay | Admitting: Family Medicine

## 2024-04-29 ENCOUNTER — Other Ambulatory Visit (INDEPENDENT_AMBULATORY_CARE_PROVIDER_SITE_OTHER)

## 2024-04-29 DIAGNOSIS — E039 Hypothyroidism, unspecified: Secondary | ICD-10-CM

## 2024-04-29 LAB — TSH: TSH: 13.33 u[IU]/mL — ABNORMAL HIGH (ref 0.35–5.50)

## 2024-04-29 LAB — T3, FREE: T3, Free: 2.8 pg/mL (ref 2.3–4.2)

## 2024-04-29 LAB — T4, FREE: Free T4: 0.76 ng/dL (ref 0.60–1.60)

## 2024-05-06 NOTE — Progress Notes (Signed)
 Agree with the assessment and plan as outlined by Ellouise Console, PA-C.  Patient is low risk for colon cancer based on her personal history of close endoscopic surveillance with lack of high risk polyps.  No further colon cancer screening/surveillance is recommended

## 2024-05-15 ENCOUNTER — Other Ambulatory Visit: Payer: Self-pay | Admitting: Family Medicine

## 2024-05-15 DIAGNOSIS — E785 Hyperlipidemia, unspecified: Secondary | ICD-10-CM

## 2024-06-01 ENCOUNTER — Other Ambulatory Visit: Payer: Self-pay | Admitting: Family Medicine

## 2024-06-01 NOTE — Telephone Encounter (Signed)
 Last office visit 03/18/24 for MWV.  Last refilled 05/28/2023 for 3 ml with 3 refills.  Last B12 level 06/12/2022 which was normal at 614 pg/mL.  Next appt: No future appointments with PCP.  Refill?

## 2024-06-03 ENCOUNTER — Other Ambulatory Visit: Payer: Self-pay | Admitting: Family Medicine

## 2024-06-03 DIAGNOSIS — E039 Hypothyroidism, unspecified: Secondary | ICD-10-CM

## 2024-06-03 MED ORDER — LEVOTHYROXINE SODIUM 75 MCG PO TABS
75.0000 ug | ORAL_TABLET | Freq: Every day | ORAL | 3 refills | Status: DC
Start: 2024-06-03 — End: 2024-07-24

## 2024-07-02 ENCOUNTER — Ambulatory Visit (HOSPITAL_BASED_OUTPATIENT_CLINIC_OR_DEPARTMENT_OTHER): Admitting: Cardiology

## 2024-07-02 ENCOUNTER — Encounter (HOSPITAL_BASED_OUTPATIENT_CLINIC_OR_DEPARTMENT_OTHER): Payer: Self-pay | Admitting: Cardiology

## 2024-07-02 VITALS — BP 124/66 | HR 63 | Resp 17 | Ht 64.0 in | Wt 149.0 lb

## 2024-07-02 DIAGNOSIS — I071 Rheumatic tricuspid insufficiency: Secondary | ICD-10-CM

## 2024-07-02 DIAGNOSIS — Z7189 Other specified counseling: Secondary | ICD-10-CM

## 2024-07-02 DIAGNOSIS — I361 Nonrheumatic tricuspid (valve) insufficiency: Secondary | ICD-10-CM

## 2024-07-02 DIAGNOSIS — R002 Palpitations: Secondary | ICD-10-CM

## 2024-07-02 DIAGNOSIS — R931 Abnormal findings on diagnostic imaging of heart and coronary circulation: Secondary | ICD-10-CM | POA: Diagnosis not present

## 2024-07-02 NOTE — Progress Notes (Signed)
 Cardiology Office Note:  .   Date:  07/02/2024  ID:  Catherine Smith, DOB September 25, 1941, MRN [id number] PCP: Catherine Greig BRAVO, MD  [facility] Providers Cardiologist:  Shelda Bruckner, MD {  History of Present Illness: .   Catherine Smith is a 83 y.o. female with PMH tricuspid regurgitation, hyperlipidemia. She was previously followed by Dr. Ammon at [place] and then Dr. Alveta and established care with me on 07/02/24.  Pertinent CV history: Was followed at [place], echo with severe TR. Echo here 01/2024 showed EF 65-70%, moderate TR with mildly enlarged RV with normal function and pressures, trivial MR, mild AR, RAP 3.  Today: Some days she feels like she is dragging, other times she feels shaky and short of breath. Both happen just once in a while. She feels like she can't work in the yard as much as she used to. Has been a gradual change. Feels occasional hard heartbeats. Occasional indigestion, better with belching.  She recalls starting bisoprolol about 3 years ago when following at [facility], was told her heart rate needed to be slower.   Has not been told that she has high blood pressure to her understanding. This appears to have been noted in a chart in 2020 from Dr. Auston  Notes that she has been anxious, feels like she cries at the drop of a hat.  ROS: Denies chest pain, shortness of breath at rest. No PND, orthopnea, LE edema or unexpected weight gain. No syncope. ROS otherwise negative except as noted.   Studies Reviewed: SABRA    EKG:  EKG Interpretation Date/Time:  Thursday July 02 2024 14:08:46 EDT Ventricular Rate:  61 PR Interval:  158 QRS Duration:  66 QT Interval:  418 QTC Calculation: 420 R Axis:   9  Text Interpretation: Normal sinus rhythm Low voltage QRS Septal infarct , age undetermined Confirmed by Bruckner Shelda [phone]) on 07/02/2024 2:23:40 PM    Physical Exam:   VS:  BP 124/66   Pulse 63   Resp 17   Ht 5' 4 (1.626 m)   Wt 149 lb (67.6  kg)   SpO2 98%   BMI 25.58 kg/m    Wt Readings from Last 3 Encounters:  07/02/24 149 lb (67.6 kg)  04/27/24 148 lb 2 oz (67.2 kg)  03/18/24 147 lb 12.8 oz (67 kg)    GEN: Well nourished, well developed in no acute distress HEENT: Normal, moist mucous membranes NECK: No JVD CARDIAC: regular rhythm, normal S1 and S2, no rubs or gallops. No murmur. VASCULAR: Radial and DP pulses 2+ bilaterally. No carotid bruits RESPIRATORY:  Clear to auscultation without rales, wheezing or rhonchi  ABDOMEN: Soft, non-tender, non-distended MUSCULOSKELETAL:  Ambulates independently SKIN: Warm and dry, no edema NEUROLOGIC:  Alert and oriented x 3. No focal neuro deficits noted. PSYCHIATRIC:  Normal affect    ASSESSMENT AND PLAN: .    Tricuspid regurgitation History of abnormal echo -moderate on echo. Reviewed her test results today -no indication for surgery or serial monitoring -reviewed signs to watch for  Question history of hypertension palpitations -on bisoprolol, appears this was started in 2020, from note appears this was for hypertension, she denies history of hypertension and reports this was for fast heart rate -ok to continue for now as she reports rare palpitations, but low threshold to stop if blood pressure drops  CV risk counseling and prevention -recommend heart healthy/Mediterranean diet, with whole grains, fruits, vegetable, fish, lean meats, nuts, and olive oil. Limit salt. -recommend moderate  walking, 3-5 times/week for 30-50 minutes each session. Aim for at least 150 minutes/week. Goal should be pace of 3 miles/hours, or walking 1.5 miles in 30 minutes -recommend avoidance of tobacco products. Avoid excess alcohol. -on simvastatin  for prevention  Dispo: I would be happy to see her back as needed  Signed, Shelda Bruckner, MD   Shelda Bruckner, MD, PhD, [facility] [place]  [facility]  [place]  [facility] V[facility] at [facility] at [facility] [address] [place], [place] [address] [phone]

## 2024-07-02 NOTE — Patient Instructions (Signed)

## 2024-07-14 ENCOUNTER — Other Ambulatory Visit (INDEPENDENT_AMBULATORY_CARE_PROVIDER_SITE_OTHER)

## 2024-07-14 ENCOUNTER — Ambulatory Visit: Payer: Self-pay | Admitting: Family Medicine

## 2024-07-14 DIAGNOSIS — E039 Hypothyroidism, unspecified: Secondary | ICD-10-CM | POA: Diagnosis not present

## 2024-07-14 LAB — T4, FREE: Free T4: 1.1 ng/dL (ref 0.60–1.60)

## 2024-07-14 LAB — T3, FREE: T3, Free: 2.8 pg/mL (ref 2.3–4.2)

## 2024-07-14 LAB — TSH: TSH: 3.81 u[IU]/mL (ref 0.35–5.50)

## 2024-07-24 ENCOUNTER — Other Ambulatory Visit: Payer: Self-pay | Admitting: Family Medicine

## 2024-08-13 ENCOUNTER — Ambulatory Visit

## 2024-08-14 ENCOUNTER — Ambulatory Visit (INDEPENDENT_AMBULATORY_CARE_PROVIDER_SITE_OTHER)

## 2024-08-14 DIAGNOSIS — Z23 Encounter for immunization: Secondary | ICD-10-CM | POA: Diagnosis not present

## 2024-08-18 ENCOUNTER — Other Ambulatory Visit: Payer: Self-pay | Admitting: Family Medicine
